# Patient Record
Sex: Male | Born: 1985 | Race: White | Hispanic: No | Marital: Single | Smoking: Current every day smoker
Health system: Southern US, Community
[De-identification: ages and names within clinical notes are randomized; demographics above are authoritative.]

## PROBLEM LIST (undated history)

## (undated) VITALS — BP 121/81 | HR 86 | Temp 97.4°F | Resp 16 | Ht 75.0 in | Wt 192.0 lb

## (undated) DIAGNOSIS — F319 Bipolar disorder, unspecified: Secondary | ICD-10-CM

## (undated) DIAGNOSIS — J189 Pneumonia, unspecified organism: Secondary | ICD-10-CM

## (undated) HISTORY — PX: OTHER SURGICAL HISTORY: SHX169

## (undated) HISTORY — PX: TYMPANOPLASTY: SHX33

## (undated) HISTORY — PX: KNEE SURGERY: SHX244

## (undated) HISTORY — PX: APPENDECTOMY: SHX54

## (undated) HISTORY — PX: TONSILLECTOMY: SUR1361

## (undated) HISTORY — PX: ARTHROSCOPIC REPAIR ACL: SUR80

---
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
## 2004-03-02 ENCOUNTER — Encounter: Admission: RE | Admit: 2004-03-02 | Discharge: 2004-03-02 | Payer: Self-pay | Admitting: Family Medicine

## 2004-03-05 ENCOUNTER — Emergency Department (HOSPITAL_COMMUNITY): Admission: EM | Admit: 2004-03-05 | Discharge: 2004-03-05 | Payer: Self-pay | Admitting: Emergency Medicine

## 2004-05-12 ENCOUNTER — Inpatient Hospital Stay (HOSPITAL_COMMUNITY): Admission: RE | Admit: 2004-05-12 | Discharge: 2004-05-16 | Payer: Self-pay | Admitting: Psychiatry

## 2004-05-12 ENCOUNTER — Ambulatory Visit: Payer: Self-pay | Admitting: Psychiatry

## 2004-10-23 ENCOUNTER — Emergency Department (HOSPITAL_COMMUNITY): Admission: EM | Admit: 2004-10-23 | Discharge: 2004-10-23 | Payer: Self-pay | Admitting: Family Medicine

## 2005-08-10 ENCOUNTER — Ambulatory Visit (HOSPITAL_COMMUNITY): Payer: Self-pay | Admitting: *Deleted

## 2005-10-04 ENCOUNTER — Emergency Department (HOSPITAL_COMMUNITY): Admission: EM | Admit: 2005-10-04 | Discharge: 2005-10-04 | Payer: Self-pay | Admitting: Emergency Medicine

## 2005-10-19 ENCOUNTER — Ambulatory Visit: Payer: Self-pay | Admitting: *Deleted

## 2005-10-19 ENCOUNTER — Inpatient Hospital Stay (HOSPITAL_COMMUNITY): Admission: AD | Admit: 2005-10-19 | Discharge: 2005-10-23 | Payer: Self-pay | Admitting: *Deleted

## 2006-12-27 ENCOUNTER — Emergency Department (HOSPITAL_COMMUNITY): Admission: EM | Admit: 2006-12-27 | Discharge: 2006-12-27 | Payer: Self-pay | Admitting: Emergency Medicine

## 2010-06-10 NOTE — Discharge Summary (Signed)
NAMEEDELMIRO, Brandon Vaughn NO.:  [id number]   MEDICAL RECORD NO.:  [id number]          PATIENT TYPE:  IPS   LOCATION:  0505                          FACILITY:  [facility]   PHYSICIAN:  Jeanice Lim, M.D. DATE OF BIRTH:  [birth year]   DATE OF ADMISSION:  05/12/2004  DATE OF DISCHARGE:  05/16/2004                                 DISCHARGE SUMMARY   IDENTIFYING DATA:  This is a 25 year old engaged white male voluntarily  admitted.  Presenting with bipolar disorder.  Aware of increasing mania.  Recent stressors are parents divorcing.  Paranoid thoughts, agitation,  aggression towards supervisor but mother usually can help.  Now she is  depressed due to her situation, she is unable to monitor the patient  closely.   PAST PSYCHIATRIC HISTORY:  Followed by Dr. Katrinka Blazing, who referred the patient  to [facility].   PRIMARY CARE PHYSICIAN:  Dr. Talmadge Coventry.   MEDICATIONS:  Risperdal, Effexor, Depakote and Toprol.   ALLERGIES:  CODEINE.   PHYSICAL EXAMINATION:  Physical and neurologic exam within normal limits.   LABORATORY DATA:  Routine admission labs within normal limits.   MENTAL STATUS EXAM:  Alert and oriented x 4.  Good eye contact.  Bright  affect.  Speech within normal limits.  Mood anxious.  Restless.  Thought  processes somewhat quick and distractible and denied acute suicidal  ideation.  No psychotic symptoms.  Cognitively intact.  Judgment and insight  fair.  Impulse control fair.   ADMISSION DIAGNOSES:   AXIS I:  Bipolar disorder, hypomanic to manic phase.   AXIS II:  Deferred.   AXIS III:  Tachycardia.   AXIS IV:  Mild to moderate (problems with primary support group).   AXIS V:  Global Assessment of Functioning 34; 60.   HOSPITAL COURSE:  The patient was admitted and ordered routine p.r.n.  medications and underwent further monitoring.  Was encouraged to participate  in individual, group and milieu therapy.  Aftercare planning was  initiated  at the time of admission as well as the patient participated in problem-  solving and learning about his illness and importance of compliance with  medications and safety issues regarding medications.  The patient reported a  positive response to medication changes.   CONDITION ON DISCHARGE:  The patient was discharged in improved condition  with no dangerous ideation after medication education.   DISCHARGE MEDICATIONS:  1.  Effexor XR 150 mg q.h.s.  2.  Depakote ER 500 mg, 2 q.h.s.  3.  Toprol XL 50 mg, 1 b.i.d. as previously prescribed.  4.  Risperdal 1 mg, 1-1/2 q.h.s.   FOLLOW UP:  To follow up with scheduled appointment with Milford Cage next  week.   DISCHARGE DIAGNOSES:   AXIS I:  Bipolar disorder, hypomanic to manic phase.   AXIS II:  Deferred.   AXIS III:  Tachycardia.   AXIS IV:  Mild to moderate (problems with primary support group).   AXIS V:  Global Assessment of Functioning on discharge 55.       JEM/MEDQ  D:  06/29/2004  T:  06/29/2004  Job:  [id number]

## 2010-06-10 NOTE — Discharge Summary (Signed)
NAMETYRIQ, MORAGNE NO.:  [id number]   MEDICAL RECORD NO.:  [id number]          PATIENT TYPE:  IPS   LOCATION:  0401                          FACILITY:  [facility]   PHYSICIAN:  Jasmine Pang, M.D. DATE OF BIRTH:  [birth year]   DATE OF ADMISSION:  10/19/2005  DATE OF DISCHARGE:  10/23/2005                                 DISCHARGE SUMMARY   IDENTIFYING INFORMATION:  A 25 year old single Caucasian male who was  admitted on an involuntary basis to our unit on October 19, 2005.   HISTORY OF PRESENT ILLNESS:  Papers state that patient is psychotic and at  risk to harm himself.  He has had decreased concentration and memory.  He  has been exhibiting strange behaviors according to his mother.  He is also  becoming quite agitated.  He is going into other patient's rooms, hitting  walls.  He appeared to be responding to internal stimuli.  The patient has a  history of bipolar disorder that is currently treated by Dr. Fabio Pierce and Dr. Betti Cruz.  This is his [facility] admission.  The patient's  mother has a history of bipolar disorder.  The patient is currently on  Depakote ER 1000 mg q.h.s. and Effexor XR 150 mg daily.   PHYSICAL FINDINGS:  The patient was in no acute physical distress.   LABORATORY FINDINGS:  An a.m. Depakote was 85.8 (50-100).  TSH was 1.249  (0.35-5.5).  Alcohol level was less than 5.  The basic metabolic panel was  grossly within normal limits.  Hepatic function panel was within normal  limits.   HOSPITAL COURSE:  Upon admission, the patient was continued on his home  medications of Depakote ER 1000 mg p.o. q.h.s. and Effexor XR 150 mg p.o.  daily, Cogentin 1 mg p.o. b.i.d. and Trilafon 8 mg p.o. q.h.s. daily.  He  was also started on Ambien 10 mg p.o. q.h.s. p.r.n. and Ativan 2 mg IM p.o.  t.i.d. p.r.n. extreme anxiety or agitation.  On October 19, 2005, due to  excessive agitation, the patient was given Haldol 10 mg IM now and with a  repeat every hour up to 30 mg.  On 18-Nov-2005, the Effexor was  deceased to 112.5 daily.  The patient was placed on Haldol 5 mg IM or p.o.  q.6h. p.r.n. agitation and Ativan 2 mg IM or p.o. q.6h. p.r.n. agitation.  The patient tolerated these medications well with no significant side-  effects.  Upon admission, the patient was very agitated.  He was going into  other peer's rooms.  He was difficult to redirect.  He was frequently  hitting the walls.  His conversation was not lucid.  He was cursing at  times.  He had earlier been cursing at staff and told a male tech he was  ready to fight.  He was able to be de-escalated and walked to the search  room.  He had a bizarre affect and was unstable behavior.  On October 21, 2005, the patient was in bed after receiving a total of  30 mg of Haldol.  He  is not actively hallucinating at this time.  He was asking appropriate  questions.  Mood was pleasant and cooperative.  He stated he needed his  contacts.   On October 22, 2005, the patient was more lucid.  He was talkative and  appropriate.  He was talkative and appropriate.  He denied auditory or  visual hallucinations.  He was much calmer and not agitated.  Mood was  somewhat anxious.  He wants to go home.  He states he feels he is well  enough now and his mother could care for him.   On October 23, 2005, the patient's mental status had improved markedly.  He  had good eye contact.  Speech was normal rate and flow.  Psychomotor  activity was within normal limits.  He was friendly and cooperative.  His  mood was less depressed and anxious.  Affect wide range.  No suicidal or  homicidal ideation.  No self injurious behavior.  No auditory or visual  hallucinations.  No paranoia or delusions.  Thoughts were logical and goal-  directed.  Thought content no predominant theme.  Cognitive was grossly  within normal limits back to baseline.   DISCHARGE DIAGNOSES:  AXIS I:  Bipolar  disorder, manic severe with  psychosis.  Rule out marijuana abuse.  AXIS II:  None.  AXIS III:  None.  AXIS IV:  Moderate (other psychosocial problems, occupational problem,  educational problem).  AXIS V:  GAF upon discharge was 50.  GAF upon admission was 25.  GAF highest  past year was 75.   DISCHARGE PLANS:  There were no specific activity level or dietary  restrictions.   DISCHARGE MEDICATIONS:  1. Depakote ER 500 mg 2 pills at bedtime.  2. Cogentin 1 mg p.o. b.i.d.  3. Trilafon 8 mg at bedtime.  4. Effexor XR 37.5 mg 3 pills daily.  5. Ambien 10 mg p.o. q.h.s. p.r.n. insomnia.   POST HOSPITAL CARE PLANS:  The patient will return to see Dr. Fabio Pierce on November 15, 2005 at 9:20 a.m.      Jasmine Pang, M.D.  Electronically Signed     BHS/MEDQ  D:  10/23/2005  T:  10/23/2005  Job:  [id number]

## 2010-06-10 NOTE — Discharge Summary (Signed)
NAMEJACINTO, Brandon Vaughn NO.:  [id number]   MEDICAL RECORD NO.:  [id number]                   PATIENT TYPE:  IPS   LOCATION:  0500                                 FACILITY:  BH   PHYSICIAN:  Geoffery Lyons, M.D.                   DATE OF BIRTH:  09-04-1985   DATE OF ADMISSION:  08/30/2002  DATE OF DISCHARGE:  09/08/2002                                 DISCHARGE SUMMARY   CHIEF COMPLAINT AND PRESENT ILLNESS:  This was the first admission to [facility] for this 25 year old male who displayed  increasingly disorganized and grandiose behavior over the past week.  He was  arrested after a car chase by police in [place] [employer]  He resisted the  arrest.  He has had pressured speech and reported he was a rock star and  needed to go to [place] to perform.  His father initiated the petition.  He  was selling things at the airport and part of his charges included  solicitation.  His urine drug screen at the other facility was positive for  marijuana.  There was a family history of bipolar illness.   PAST PSYCHIATRIC HISTORY:  At age 22, he did take 10 or so Aleve.  He  admitted he was acting out.   FAMILY HISTORY:  History of bipolar disorder.  He had a sister who died from  Marfan's syndrome.   SUBSTANCE ABUSE HISTORY:  He stated he used alcohol and marijuana once a  month or so.   PAST MEDICAL HISTORY:  Allergies.   PHYSICAL EXAMINATION:  Physical examination was performed, failed to show  any acute findings.   MENTAL STATUS EXAM:  Mental status exam revealed a drowsy male but oriented  to person, place, and time.  He was medicated, unkempt, decreased personal  hygiene, could be redirected, cooperative.  Slurred speech secondary to the  medication.  Mood: Irritable.  Thought processes: Continued to support  delusions of a rock star, had to go to [place].  Judgment and insight were  poor.  Concentration and memory were poor.   Quite impulsive.   ADMISSION DIAGNOSES:   AXIS I:  Bipolar disorder, manic.   AXIS II:  No diagnosis.   AXIS III:  1. Status post repair of left anterior cruciate ligament.  2. Seasonal allergies.   AXIS IV:  Moderate.   AXIS V:  Global assessment of functioning upon admission 25, highest global  assessment of functioning in the last year 75-80.   LABORATORY DATA:  Other laboratory workup: CBC was within normal limits.  Blood chemistries were within normal limits.  Thyroid profile was within  normal limits.  CT scan of the head failed to show any acute findings.   HOSPITAL COURSE:  He was admitted and started in intensive individual and  group psychotherapy.  He did  evidence a lot of fluctuations.  He was given  Geodon IM.  He was given some Ativan IM.  He was pretty sedated with the  medications.  He was switched to Zyprexa Zydis 5 mg every six hours as  needed.  He was placed on Zyprexa Zydis 5 mg at night.  He was too sedated  so we went ahead and we switched to Risperdal as that was his main  complaint.  We went Risperdal M-Tabs 1 mg twice a day and he was given  Depakote that was increased up to 1000 mg at night.  He was initially very  resistant to taking the medications, very grandiose, claiming an IQ  extremely high.  He wanted to leave the hospital as he had to go to school,  go with his band, very expansive, irritable, angry, absolutely no insight.  He was initially claiming there was nothing wrong with him.  He claimed not  really to be hyperactive but there was the verification that family felt  that he was still not his baseline.  Mother described him as a very laid  back, subdued, a good child.  He insisted that he could play the piano and  the mother confirmed that he did not know how to.  As the medication got  into his system and he was less resistant to it, he started coming around.  He was initially involuntarily committed to the unit.  We requested seven   more days.  He actually accepted it without giving [place] a very hard time.  He  continued to settle down.  He had a family session with his parents.  He was  coming around.  He was able to see how all his behavior suggested bipolar  illness.  He was willing to maintain the medications.  So, on August 16, he  was in full contact with reality.  There was no evidence of active  delusional ideas, no hallucinations, no evidence of grandiosity, increased  insight, no suicidal ideas, no homicidal ideas.  He was willing to stay on  medications so we discharged to outpatient followup.   DISCHARGE DIAGNOSES:   AXIS I:  1. Bipolar disorder, manic.  2. Marijuana abuse.   AXIS II:  No diagnosis.   AXIS III:  1. Status post surgery for his ankle ligament.  2. Seasonal allergies.   AXIS IV:  Moderate.   AXIS V:  Global assessment of functioning upon discharge 55-60.   DISCHARGE MEDICATIONS:  1. Risperdal M-Tabs 1 mg twice a day.  2. Depakote ER 1000 mg at night.  3. Ambien as needed for sleep.   FOLLOW UP:  He was to follow up with mental health IOP at [facility] and then Jasmine Pang, M.D.                                                Geoffery Lyons, M.D.    IL/MEDQ  D:  10/01/2002  T:  10/03/2002  Job:  [id number]

## 2010-06-10 NOTE — H&P (Signed)
NAMEFIRMAN, PETROW NO.:  [id number]   MEDICAL RECORD NO.:  [id number]          PATIENT TYPE:  IPS   LOCATION:  0505                          FACILITY:  BH   PHYSICIAN:  Jeanice Lim, M.D. DATE OF BIRTH:  [birth year]   DATE OF ADMISSION:  05/12/2004  DATE OF DISCHARGE:                         PSYCHIATRIC ADMISSION ASSESSMENT   IDENTIFYING INFORMATION:  Brandon Vaughn is a 25 year old engaged white male who  is voluntarily admitted to the behavioral health center on May 12, 2004.   HISTORY OF PRESENT ILLNESS:  Patient is diagnosed with bipolar disorder and  is very aware of his increasing mania, recent increased stressors of his  parent's divorcing have increased his anxiety, and the patient began having  paranoid thoughts, agitation and aggression toward his supervisor.  Normally  the patient's mother can help with extra medication and therapy, but as she  is depressed due to her situation, she has been unable to monitor the  patient as closely.  The patient contacted his psychiatrist, Dr. Katrinka Blazing, and  she referred the patient to the [facility].   PAST PSYCHIATRIC HISTORY:  The patient was inpatient at [facility] in August 2004, and he is followed by Milford Cage as an outpatient.   SOCIAL HISTORY:  The patient lives with his mother and father in [place].  He has a high school diploma.  He goes to school part-time at [employer]  He works at [employer].  He denies any legal problems at this time.  He  describes his mother as being his social support system.   FAMILY HISTORY:  His mother has depression.  His maternal grandfather was  bipolar, and there is no alcohol or drug abuse in the family that he knows  of.  He denies any alcohol or drug use himself.   PRIMARY CARE Harshini Trent:  Dr. Sheran Fava.   MEDICAL PROBLEMS:  He denies any medical problems, although he is a tobacco  abuser of one pack per day x 2 years.   MEDICATIONS:  1.  Risperdal 1 mg at bedtime.  2.  Effexor 150 mg daily.  3.  Depakote 1000 mg daily.  4.  Toprol XL 50 mg as needed for racing heart rate.   DRUG ALLERGIES:  CODEINE which causes an itch.   REVIEW OF SYSTEMS:  The patient reports that he has had at 15-pound weight  gain since his Depakote dosage was increased recently, over the last 3  months.  He wears contact lenses.  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   PHYSICAL EXAMINATION:  VITAL SIGNS:  Temperature 97.4, pulse 99,  respirations 20, blood pressure 126/76.  GENERAL:  He is a tall, thin, well-developed, well-nourished male who looks  his stated age of 20.  NECK:  He has a full range of motion with 5/5 strength with no  lymphadenopathy.  LUNGS:  Clear to auscultation bilaterally.  BREAST:  Exam was deferred.  HEART:  Regular rate and rhythm without rub, gallop or murmur, no carotid  bruits.  Equal peripheral pulses.  ABDOMEN:  Flat,  firm, nontender with normal bowel sounds.  GENITOURINARY:  Exam deferred.  EXTREMITIES:  A full range of motion with 5/5 strength.  SKIN:  Warm and dry without lesion.  NEUROLOGIC:  Grossly intact.   LABORATORY DATA:  CBC was normal.  His blood chemistries were normal except  for a low BUN of 3.  His TSH was elevated at 8.762.   MENTAL STATUS EXAM:  He was alert and oriented x 4, had good eye contact  with a bright affect.  His appearance was casual.  His behavior was calm and  cooperative.  His speech was clear and soft with even pace and tone.  His  mood was anxious as he was restless.  His thought process was coherent  without suicidal or homicidal ideation.  He had no psychosis and no mania.  His cognitive function, his concentration was normal.  His memory was  intact.  His insight was good.  His impulse control is fair.   DIAGNOSES:   AXIS I:  Bipolar disorder, manic episode.   AXIS II:  Deferred.   AXIS III:  Tachycardia.   AXIS IV:  Mild for problems with his primary  support group.   AXIS V:  Current global assessment of function is 34, over the past year  range of 60-65.   PLAN:  Admit voluntarily, stabilize to thought.  We will resume his home  medications.  We will check a Depakote level again.  We will work to  increase his coping skills, decrease his stressors.  We will have a family  session with his mother and father, and he can follow up with his  psychiatrist, Milford Cage.  His tentative length of stay is 4-6 days.      AHW/MEDQ  D:  05/13/2004  T:  05/13/2004  Job:  [id number]

## 2010-06-10 NOTE — H&P (Signed)
NAMEHUSAYN, REIM NO.:  [id number]   MEDICAL RECORD NO.:  [id number]                   PATIENT TYPE:  IPS   LOCATION:  0402                                 FACILITY:   PHYSICIAN:  Jeanice Lim, M.D.              DATE OF BIRTH:  [birth year]   DATE OF ADMISSION:  DATE OF DISCHARGE:                         PSYCHIATRIC ADMISSION ASSESSMENT   REASON FOR ADMISSION:  Identifying information from the patient and  accompanying records. This 25 year old single white male has displayed  increasingly disorganized and grandiose behavior over the past week. He was  arrested after a car chase by police at [employer] airport. He resisted  arrest. He had pressured speech and reported that he was a rock star and  needed to go to [place] to perform. His father had initiated a petition.  The patient was telling things at the airport and part of his charges  included solicitation. His urine drug screen at the other facility was  positive for THC.   He does have a family history for bipolar illness. It is unclear  whether  the patient in fact has been diagnosed with bipolar prior to this manic  episode or not. Unfortunately the patient is unable to give me information  and his parents are not available at this time. We will be trying to  ascertain other  information.   PAST PSYCHIATRIC HISTORY:  At age 20 the patient did take 10 or so Aleve. He  told me this himself. He said he was acting out. The records indicate that  he overdosed after a breakup with a girlfriend.   SOCIAL HISTORY:  He has graduated high school. He attends [employer]. He is  studying Scientist, physiological.   FAMILY HISTORY:  His maternal grandfather was bipolar. His mother is  depression versus bipolar. He had a sister who died from Marfan syndrome. I  am not clear  exactly when she died. He states it was 5 years ago. It seemed  to be a little more recent  from the accompanying records.   ALCOHOL  AND DRUG HISTORY:  He states that he uses alcohol  and marijuana  about once a month or so.   PAST MEDICAL HISTORY:  His primary care physician is Dr. Talmadge Coventry.  She treats him for allergies and  he is treated with Allegra.   ALLERGIES:  PENICILLIN AND MORPHINE. HE STATES THAT WHEN HE HAD KNEE SURGERY  TO REPAIR AN ACL INJURY THE MORPHINE MADE HIM ITCHY.   PHYSICAL EXAMINATION:  The patient is unusually tall and thin. The remainder  of his physical examination revealed that his lungs were clear. His heart  had a regular rate and rhythm. His abdomen is soft. He had no clubbing,  cyanosis or edema. Cranial nerves 2 to 12 grossly intact. He had poor dental  hygiene. His hygiene is not at the best at the  moment in general.   MENTAL STATUS EXAM:  He is drowsy but he knows person, place and time. He  has been medicated. He is unkempt. He has decreased personal hygiene. He can  be directed. He is cooperative. He is requesting discharge  for a CT scan.  His speech is slurred secondary to his medications. His mood is somewhat  irritable. His thought processes continue to support his delusion that he is  a Solicitor and has to go to [place]. His judgment and insight are poor at  this time. His  concentration and memory are poor. His intelligence is  average.    DIAGNOSES:   AXIS I:  Bipolar 1 manic with psychosis, being that he is believing that he  is a rock star.   AXIS II:  Deferred.   AXIS III:  1. Status post repair of left anterior cruciate ligament.  2. Seasonal allergies treated with Allegra.   AXIS IV:  Mild.   AXIS V:  25.   PLAN:  Admit for safety and stabilization  of his mania. Further information  will be ascertained as to his history, whether he has had any prior  treatment or has had any prior indications of bipolar illness. Estimated  length of stay is 5 to 7 days. He came in and was given Geodon 60 mg p.o.  b.i.d. with food and Ambien 10 mg q. h.s. He  seems to be responding to this.                                               Jeanice Lim, M.D.    Lovie Macadamia  D:  08/31/2002  T:  08/31/2002  Job:  [id number]

## 2010-10-31 LAB — URINALYSIS, ROUTINE W REFLEX MICROSCOPIC
Ketones, ur: NEGATIVE
Nitrite: NEGATIVE
Protein, ur: NEGATIVE
Urobilinogen, UA: 0.2

## 2010-10-31 LAB — BASIC METABOLIC PANEL
BUN: 6
Calcium: 10
Creatinine, Ser: 1.03
GFR calc non Af Amer: >60
Glucose, Bld: 126 — ABNORMAL HIGH

## 2010-10-31 LAB — DIFFERENTIAL
Basophils Absolute: 0
Eosinophils Relative: 1
Lymphocytes Relative: 19
Neutro Abs: 7.5
Neutrophils Relative %: 69

## 2010-10-31 LAB — CBC
HCT: 45.4
Platelets: 188
RDW: 12.4

## 2010-10-31 LAB — TRICYCLICS SCREEN, URINE: TCA Scrn: NOT DETECTED

## 2010-10-31 LAB — RAPID URINE DRUG SCREEN, HOSP PERFORMED
Amphetamines: POSITIVE — AB
Benzodiazepines: POSITIVE — AB

## 2011-01-04 ENCOUNTER — Emergency Department (HOSPITAL_COMMUNITY)
Admission: EM | Admit: 2011-01-04 | Discharge: 2011-01-04 | Disposition: A | Discharge status: Home / Self Care | Payer: Worker's Compensation | Attending: Emergency Medicine | Admitting: Emergency Medicine

## 2011-01-04 ENCOUNTER — Encounter: Payer: Self-pay | Admitting: *Deleted

## 2011-01-04 ENCOUNTER — Emergency Department (HOSPITAL_COMMUNITY): Payer: Worker's Compensation

## 2011-01-04 DIAGNOSIS — S61319A Laceration without foreign body of unspecified finger with damage to nail, initial encounter: Secondary | ICD-10-CM

## 2011-01-04 DIAGNOSIS — Y9269 Other specified industrial and construction area as the place of occurrence of the external cause: Secondary | ICD-10-CM | POA: Insufficient documentation

## 2011-01-04 DIAGNOSIS — F17200 Nicotine dependence, unspecified, uncomplicated: Secondary | ICD-10-CM | POA: Insufficient documentation

## 2011-01-04 DIAGNOSIS — W230XXA Caught, crushed, jammed, or pinched between moving objects, initial encounter: Secondary | ICD-10-CM | POA: Insufficient documentation

## 2011-01-04 DIAGNOSIS — S62639B Displaced fracture of distal phalanx of unspecified finger, initial encounter for open fracture: Secondary | ICD-10-CM | POA: Insufficient documentation

## 2011-01-04 MED ORDER — CEPHALEXIN 500 MG PO CAPS
1000.0000 mg | ORAL_CAPSULE | Freq: Once | ORAL | Status: AC
  Administered 2011-01-04: 1000 mg via ORAL
  Filled 2011-01-04: qty 2

## 2011-01-04 MED ORDER — BUPIVACAINE HCL (PF) 0.5 % IJ SOLN
INTRAMUSCULAR | Status: AC
  Administered 2011-01-04: 16:00:00
  Filled 2011-01-04: qty 30

## 2011-01-04 MED ORDER — TRAMADOL HCL 50 MG PO TABS: 50.0000 mg | ORAL_TABLET | Freq: Four times a day (QID) | ORAL | Status: AC | PRN

## 2011-01-04 MED ORDER — BACITRACIN-NEOMYCIN-POLYMYXIN 400-5-5000 EX OINT
TOPICAL_OINTMENT | CUTANEOUS | Status: AC
  Administered 2011-01-04: 17:00:00
  Filled 2011-01-04: qty 1

## 2011-01-04 MED ORDER — CEPHALEXIN 500 MG PO CAPS: 500.0000 mg | ORAL_CAPSULE | Freq: Four times a day (QID) | ORAL | Status: AC

## 2011-01-04 NOTE — ED Provider Notes (Signed)
Medical screening examination/treatment/procedure(s) were performed by non-physician practitioner and as supervising physician I was immediately available for consultation/collaboration.   Beyonce Sawatzky W Kiela Shisler, MD 01/04/11 2115 

## 2011-01-04 NOTE — ED Notes (Signed)
Pt states she hit his finger on metal edge and cut end of left little finger.

## 2011-01-04 NOTE — ED Notes (Signed)
Left fifth finger dressed with neosporin, xeroform, telfa and kling.  Instructions given and reviewed-verbalizes understanding.  Escorted to lab for urine drug screen.

## 2011-01-04 NOTE — ED Provider Notes (Signed)
History     CSN: [id number] Arrival date & time: 01/04/2011 12:43 PM   First MD Initiated Contact with Patient 01/04/11 1321      Chief Complaint  Patient presents with  . Extremity Laceration    (Consider location/radiation/quality/duration/timing/severity/associated sxs/prior treatment) HPI Comments: Using a wrench and pushing really hard to loosen a nut.  The wrench slipped off and pt crushed L 5th distal phalynx.  DT UTD  The history is provided by the patient. No language interpreter was used.    History reviewed. No pertinent past medical history.  Past Surgical History  Procedure Date  . Tonsillectomy   . Appendectomy   . Arthroscopic repair acl   . Lymph node removal   . Tympanoplasty     History reviewed. No pertinent family history.  History  Substance Use Topics  . Smoking status: Current Everyday Smoker -- 1.0 packs/day  . Smokeless tobacco: Not on file  . Alcohol Use: No      Review of Systems  Musculoskeletal:       Trauma   All other systems reviewed and are negative.    Allergies  Bee venom  Home Medications  No current outpatient prescriptions on file.  BP 123/82  Pulse 92  Temp(Src) 97.3 F (36.3 C) (Oral)  Resp 18  Ht 6\' 3"  (1.905 m)  Wt 180 lb (81.647 kg)  BMI 22.50 kg/m2  SpO2 99%  Physical Exam  Nursing note and vitals reviewed. Constitutional: He is oriented to person, place, and time. He appears well-developed and well-nourished.  HENT:  Head: Normocephalic and atraumatic.  Eyes: EOM are normal.  Neck: Normal range of motion.  Cardiovascular: Normal rate, regular rhythm, normal heart sounds and intact distal pulses.   Pulmonary/Chest: Effort normal and breath sounds normal. No respiratory distress.  Abdominal: Soft. He exhibits no distension. There is no tenderness.  Musculoskeletal: Normal range of motion. He exhibits tenderness.       Left hand: He exhibits tenderness, bony tenderness, laceration and swelling. He  exhibits normal range of motion, normal capillary refill and no deformity. normal sensation noted. Normal strength noted.       Hands: Neurological: He is alert and oriented to person, place, and time.  Skin: Skin is warm and dry.  Psychiatric: He has a normal mood and affect. Judgment normal.    ED Course  LACERATION REPAIR Date/Time: 01/04/2011 4:05 PM Performed by: Worthy Rancher Authorized by: Worthy Rancher Consent: Verbal consent obtained. Written consent not obtained. Risks and benefits: risks, benefits and alternatives were discussed Consent given by: patient Patient understanding: patient states understanding of the procedure being performed Site marked: the operative site was not marked Imaging studies: imaging studies available Required items: required blood products, implants, devices, and special equipment available Patient identity confirmed: verbally with patient Time out: Immediately prior to procedure a "time out" was called to verify the correct patient, procedure, equipment, support staff and site/side marked as required. Body area: upper extremity Location details: left small finger Laceration length: 2.5 cm Foreign bodies: no foreign bodies Tendon involvement: none Nerve involvement: none Vascular damage: no Anesthesia: digital block Local anesthetic: bupivacaine 0.5% without epinephrine Anesthetic total: 6 ml Patient sedated: no Preparation: Patient was prepped and draped in the usual sterile fashion. Irrigation solution: saline Irrigation method: syringe Amount of cleaning: extensive Debridement: none Degree of undermining: none Wound skin closure material used: nailbed closed with #5 6-0 vicryl interrupted sutures. Number of sutures: 5 Technique: simple Approximation: close Approximation  difficulty: simple Dressing: gauze roll, non-adhesive packing strip and antibiotic ointment Patient tolerance: Patient tolerated the procedure well with no  immediate complications. Comments: After digital block performed, nail undermined using iris scissors.  Same procedure used to remove proximal section of nail covering the nail matrix.  Minimal bleeding.     (including critical care time)  Labs Reviewed - No data to display Dg Finger Little Left  01/04/2011  *RADIOLOGY REPORT*  Clinical Data: Trauma.  LEFT LITTLE FINGER 2+V  Comparison: None.  Findings: Fracture of the tuft of the left fifth finger distal phalanx.  No radiopaque foreign body.  IMPRESSION: Fracture of the tuft of the left fifth finger distal phalanx.  No radiopaque foreign body.  Original Report Authenticated By: Fuller Canada, M.D.     No diagnosis found.    MDM          Worthy Rancher, PA 01/04/11 [phone]

## 2011-01-04 NOTE — ED Notes (Signed)
ED PA at bedside to suture wound

## 2011-02-21 ENCOUNTER — Other Ambulatory Visit: Payer: Self-pay

## 2011-02-21 ENCOUNTER — Observation Stay (HOSPITAL_COMMUNITY)
Admission: EM | Admit: 2011-02-21 | Discharge: 2011-02-22 | DRG: 143 | Disposition: A | Discharge status: Home / Self Care | Payer: BC Managed Care – PPO | Attending: Internal Medicine | Admitting: Internal Medicine

## 2011-02-21 ENCOUNTER — Emergency Department (HOSPITAL_COMMUNITY): Payer: BC Managed Care – PPO

## 2011-02-21 ENCOUNTER — Encounter (HOSPITAL_COMMUNITY): Payer: Self-pay | Admitting: *Deleted

## 2011-02-21 DIAGNOSIS — R799 Abnormal finding of blood chemistry, unspecified: Secondary | ICD-10-CM | POA: Diagnosis present

## 2011-02-21 DIAGNOSIS — R197 Diarrhea, unspecified: Secondary | ICD-10-CM

## 2011-02-21 DIAGNOSIS — R079 Chest pain, unspecified: Secondary | ICD-10-CM

## 2011-02-21 DIAGNOSIS — I514 Myocarditis, unspecified: Secondary | ICD-10-CM

## 2011-02-21 DIAGNOSIS — D696 Thrombocytopenia, unspecified: Secondary | ICD-10-CM | POA: Diagnosis present

## 2011-02-21 DIAGNOSIS — Z79899 Other long term (current) drug therapy: Secondary | ICD-10-CM

## 2011-02-21 DIAGNOSIS — R0789 Other chest pain: Principal | ICD-10-CM | POA: Insufficient documentation

## 2011-02-21 DIAGNOSIS — D649 Anemia, unspecified: Secondary | ICD-10-CM | POA: Diagnosis present

## 2011-02-21 DIAGNOSIS — F17200 Nicotine dependence, unspecified, uncomplicated: Secondary | ICD-10-CM | POA: Diagnosis present

## 2011-02-21 DIAGNOSIS — F319 Bipolar disorder, unspecified: Secondary | ICD-10-CM | POA: Diagnosis present

## 2011-02-21 DIAGNOSIS — Z8241 Family history of sudden cardiac death: Secondary | ICD-10-CM

## 2011-02-21 DIAGNOSIS — E860 Dehydration: Secondary | ICD-10-CM

## 2011-02-21 DIAGNOSIS — I498 Other specified cardiac arrhythmias: Secondary | ICD-10-CM | POA: Diagnosis present

## 2011-02-21 HISTORY — DX: Bipolar disorder, unspecified: F31.9

## 2011-02-21 HISTORY — DX: Pneumonia, unspecified organism: J18.9

## 2011-02-21 LAB — CREATININE, SERUM
Creatinine, Ser: 0.77 mg/dL (ref 0.50–1.35)
GFR calc non Af Amer: >90 mL/min (ref >90)

## 2011-02-21 LAB — DIFFERENTIAL
Basophils Absolute: 0 10*3/uL (ref 0.0–0.1)
Lymphocytes Relative: 10 % — ABNORMAL LOW (ref 12–46)
Monocytes Absolute: 1.3 10*3/uL — ABNORMAL HIGH (ref 0.1–1.0)
Neutro Abs: 8.5 10*3/uL — ABNORMAL HIGH (ref 1.7–7.7)

## 2011-02-21 LAB — BASIC METABOLIC PANEL
CO2: 28 mEq/L (ref 19–32)
Chloride: 104 mEq/L (ref 96–112)
Creatinine, Ser: 0.69 mg/dL (ref 0.50–1.35)
Sodium: 140 mEq/L (ref 135–145)

## 2011-02-21 LAB — CBC
HCT: 34.8 % — ABNORMAL LOW (ref 39.0–52.0)
Hemoglobin: 13.2 g/dL (ref 13.0–17.0)
MCHC: 34.9 g/dL (ref 30.0–36.0)
Platelets: 150 10*3/uL (ref 150–400)
RDW: 12.6 % (ref 11.5–15.5)
RDW: 12.8 % (ref 11.5–15.5)
WBC: 11 10*3/uL — ABNORMAL HIGH (ref 4.0–10.5)

## 2011-02-21 LAB — CARDIAC PANEL(CRET KIN+CKTOT+MB+TROPI)
CK, MB: 6.2 ng/mL (ref 0.3–4.0)
CK, MB: 5.7 ng/mL — ABNORMAL HIGH (ref 0.3–4.0)
CK, MB: 4.4 ng/mL — ABNORMAL HIGH (ref 0.3–4.0)
CK, MB: 3.7 ng/mL (ref 0.3–4.0)
Relative Index: 6 — ABNORMAL HIGH (ref 0.0–2.5)
Relative Index: INVALID (ref 0.0–2.5)
Total CK: 104 U/L (ref 7–232)
Troponin I: 0.71 ng/mL (ref <0.30)
Troponin I: 0.38 ng/mL (ref <0.30)

## 2011-02-21 LAB — RAPID URINE DRUG SCREEN, HOSP PERFORMED
Barbiturates: NOT DETECTED
Cocaine: NOT DETECTED

## 2011-02-21 LAB — TSH: TSH: 3.255 u[IU]/mL (ref 0.350–4.500)

## 2011-02-21 LAB — MAGNESIUM: Magnesium: 1.6 mg/dL (ref 1.5–2.5)

## 2011-02-21 MED ORDER — IOHEXOL 350 MG/ML SOLN
50.0000 mL | Freq: Once | INTRAVENOUS | Status: AC | PRN
  Administered 2011-02-21: 50 mL via INTRAVENOUS

## 2011-02-21 MED ORDER — ACETAMINOPHEN 325 MG PO TABS: 650.0000 mg | ORAL_TABLET | ORAL | Status: DC | PRN

## 2011-02-21 MED ORDER — MORPHINE SULFATE 4 MG/ML IJ SOLN
4.0000 mg | INTRAMUSCULAR | Status: DC | PRN
  Administered 2011-02-21: 4 mg via INTRAVENOUS
  Filled 2011-02-21: qty 1

## 2011-02-21 MED ORDER — GI COCKTAIL ~~LOC~~
30.0000 mL | Freq: Three times a day (TID) | ORAL | Status: DC | PRN
  Administered 2011-02-21: 30 mL via ORAL
  Filled 2011-02-21: qty 30

## 2011-02-21 MED ORDER — NITROGLYCERIN 0.4 MG SL SUBL: 0.4000 mg | SUBLINGUAL_TABLET | SUBLINGUAL | Status: DC | PRN

## 2011-02-21 MED ORDER — ONDANSETRON HCL 4 MG/2ML IJ SOLN
4.0000 mg | Freq: Once | INTRAMUSCULAR | Status: AC
  Administered 2011-02-21: 4 mg via INTRAVENOUS
  Filled 2011-02-21: qty 2

## 2011-02-21 MED ORDER — SODIUM CHLORIDE 0.9 % IJ SOLN: 3.0000 mL | INTRAMUSCULAR | Status: DC | PRN

## 2011-02-21 MED ORDER — DIVALPROEX SODIUM ER 500 MG PO TB24
500.0000 mg | ORAL_TABLET | Freq: Two times a day (BID) | ORAL | Status: DC
  Administered 2011-02-21 – 2011-02-22 (×3): 500 mg via ORAL
  Filled 2011-02-21 (×4): qty 1

## 2011-02-21 MED ORDER — VITAMIN C 500 MG PO TABS
500.0000 mg | ORAL_TABLET | Freq: Two times a day (BID) | ORAL | Status: DC
  Administered 2011-02-21 – 2011-02-22 (×3): 500 mg via ORAL
  Filled 2011-02-21 (×4): qty 1

## 2011-02-21 MED ORDER — VENLAFAXINE HCL 75 MG PO TABS
75.0000 mg | ORAL_TABLET | Freq: Two times a day (BID) | ORAL | Status: DC
  Administered 2011-02-21 – 2011-02-22 (×3): 75 mg via ORAL
  Filled 2011-02-21 (×4): qty 1

## 2011-02-21 MED ORDER — ENOXAPARIN SODIUM 40 MG/0.4ML ~~LOC~~ SOLN
40.0000 mg | SUBCUTANEOUS | Status: DC
  Administered 2011-02-21 – 2011-02-22 (×2): 40 mg via SUBCUTANEOUS
  Filled 2011-02-21 (×3): qty 0.4

## 2011-02-21 MED ORDER — AMPHETAMINE-DEXTROAMPHETAMINE 10 MG PO TABS: 20.0000 mg | ORAL_TABLET | ORAL | Status: DC

## 2011-02-21 MED ORDER — MORPHINE SULFATE 4 MG/ML IJ SOLN
4.0000 mg | Freq: Once | INTRAMUSCULAR | Status: AC
  Administered 2011-02-21: 4 mg via INTRAVENOUS
  Filled 2011-02-21: qty 1

## 2011-02-21 MED ORDER — ONE-A-DAY MENS PO TABS
1.0000 | ORAL_TABLET | Freq: Every day | ORAL | Status: DC
  Administered 2011-02-21 – 2011-02-22 (×2): 1 via ORAL
  Filled 2011-02-21 (×2): qty 1

## 2011-02-21 MED ORDER — SODIUM CHLORIDE 0.9 % IJ SOLN
3.0000 mL | Freq: Two times a day (BID) | INTRAMUSCULAR | Status: DC
  Administered 2011-02-21 – 2011-02-22 (×3): 3 mL via INTRAVENOUS

## 2011-02-21 MED ORDER — SODIUM CHLORIDE 0.9 % IV SOLN: 250.0000 mL | INTRAVENOUS | Status: DC | PRN

## 2011-02-21 MED ORDER — ONDANSETRON HCL 4 MG/2ML IJ SOLN: 4.0000 mg | Freq: Four times a day (QID) | INTRAMUSCULAR | Status: DC | PRN

## 2011-02-21 MED ORDER — ASPIRIN 81 MG PO CHEW
324.0000 mg | CHEWABLE_TABLET | Freq: Once | ORAL | Status: AC
  Administered 2011-02-21: 324 mg via ORAL
  Filled 2011-02-21: qty 4

## 2011-02-21 MED ORDER — SODIUM CHLORIDE 0.9 % IV BOLUS (SEPSIS)
1000.0000 mL | Freq: Once | INTRAVENOUS | Status: AC
  Administered 2011-02-21: 1000 mL via INTRAVENOUS

## 2011-02-21 MED ORDER — ASPIRIN EC 81 MG PO TBEC
81.0000 mg | DELAYED_RELEASE_TABLET | Freq: Every day | ORAL | Status: DC
  Administered 2011-02-22: 81 mg via ORAL
  Filled 2011-02-21: qty 1

## 2011-02-21 NOTE — ED Provider Notes (Signed)
History     CSN: [id number]  Arrival date & time 02/21/11  0104   First MD Initiated Contact with Patient 02/21/11 0143      Chief Complaint  Patient presents with  . Chest Pain  . 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 States that for the past 4 days he is been experiencing nausea, vomiting, diarrhea. He states that his nausea and vomiting resolved 2 days ago but he is still having some diarrhea. He describes as nonbloody. He denies abdominal pain. He states that yesterday he began to feel severe chest pain. It is sharp and does not radiate to his back but does get worse with lying flat on his back. He denies fevers. He is having chills. There is no cough. There multiple sick contacts at home with diarrhea. He denies shortness of breath and states that the pain does get worse when he takes a deep breath. States that he has a suspected history of Marfan's. He has a positive family history and his sister died at the age of 49 from a ruptured aorta per patient.    ED Notes, ED Provider Notes from 02/21/11 0000 to 02/21/11 01:21:29       Wynona Canes Chrisco, RN 02/21/2011 01:13      The pt is c/o nausea Vomiting and diarrhea since Friday with some chest pain with vomiting     History reviewed. No pertinent past medical history.  Past Surgical History  Procedure Date  . Tonsillectomy   . Appendectomy   . Arthroscopic repair acl   . Lymph node removal   . Tympanoplasty     History reviewed. No pertinent family history.  History  Substance Use Topics  . Smoking status: Current Everyday Smoker -- 1.0 packs/day  . Smokeless tobacco: Not on file  . Alcohol Use: No      Review of Systems  All other systems reviewed and are negative.   except as noted HPI   Allergies  Bee venom; Codeine; and Hydrocodone  Home Medications   Current Outpatient Rx    Name Route Sig Dispense Refill  . AMPHETAMINE-DEXTROAMPHETAMINE 20 MG PO TABS Oral Take 20 mg by mouth every morning.      Marland Kitchen DIVALPROEX SODIUM ER 500 MG PO TB24 Oral Take 500 mg by mouth 2 (two) times daily.    . ONE-A-DAY MENS PO TABS Oral Take 1 tablet by mouth daily.      Marland Kitchen NAPROXEN SODIUM 220 MG PO TABS Oral Take 220 mg by mouth 2 (two) times daily with a meal.    . VENLAFAXINE HCL 75 MG PO TABS Oral Take 75 mg by mouth 2 (two) times daily.    Marland Kitchen VITAMIN C 500 MG PO TABS Oral Take 500 mg by mouth 2 (two) times daily.        BP 112/67  Pulse 119  Temp(Src) 98.4 F (36.9 C) (Oral)  Resp 18  SpO2 100%  Physical Exam  Nursing note and vitals reviewed. Constitutional: He is oriented to person, place, and time. He appears well-developed and well-nourished. No distress.  HENT:  Head: Atraumatic.       Mm dry  Eyes: Conjunctivae are normal. Pupils are equal, round, and reactive to light.  Neck: Neck supple. No JVD present.  Cardiovascular: Regular rhythm, normal heart sounds and intact distal pulses.  Exam reveals no gallop and no friction rub.   No murmur heard.  tachycardic  Pulmonary/Chest: Effort normal. No respiratory distress. He has no wheezes. He has no rales.  Abdominal: Soft. Bowel sounds are normal. There is no tenderness. There is no rebound and no guarding.  Musculoskeletal: Normal range of motion. He exhibits no edema and no tenderness.  Neurological: He is alert and oriented to person, place, and time.  Skin: Skin is warm and dry.  Psychiatric: He has a normal mood and affect.    Date: 02/21/2011  Rate: 111  Rhythm: sinus tachycardia  QRS Axis: normal  Intervals: normal  ST/T Wave abnormalities: nonspecific ST changes  Conduction Disutrbances:none  Narrative Interpretation:   Old EKG Reviewed: none available  ED Course  Procedures (including critical care time)  Labs Reviewed  CBC - Abnormal; Notable for the following:    WBC 11.0 (*)    RBC 4.12 (*)     Hemoglobin 12.3 (*)    HCT 34.8 (*)    Platelets 132 (*)    All other components within normal limits  DIFFERENTIAL - Abnormal; Notable for the following:    Neutrophils Relative 78 (*)    Neutro Abs 8.5 (*)    Lymphocytes Relative 10 (*)    Monocytes Absolute 1.3 (*)    All other components within normal limits  BASIC METABOLIC PANEL - Abnormal; Notable for the following:    BUN 4 (*)    All other components within normal limits  CARDIAC PANEL(CRET KIN+CKTOT+MB+TROPI) - Abnormal; Notable for the following:    CK, MB 6.2 (*)    Troponin I 0.71 (*)    Relative Index 6.0 (*)    All other components within normal limits  PRO B NATRIURETIC PEPTIDE - Abnormal; Notable for the following:    Pro B Natriuretic peptide (BNP) 1446.0 (*)    All other components within normal limits   Ct Angio Chest W/cm &/or Wo Cm  02/21/2011  *RADIOLOGY REPORT*  Clinical Data: Severe chest pain and back pain.  Nausea, vomiting, and diarrhea.  Concern for dissection in the chest.  CT ANGIOGRAPHY CHEST  Technique:  Multidetector CT imaging of the chest using the standard protocol during bolus administration of intravenous contrast. Multiplanar reconstructed images including MIPs were obtained and reviewed to evaluate the vascular anatomy.  Contrast: 50mL OMNIPAQUE IOHEXOL 350 MG/ML IV SOLN  Comparison: None.  Findings: Unenhanced images demonstrate normal caliber thoracic aorta.  No significant vascular calcification.  No evidence of penetrating ulcer or intramural hematoma.  Images obtained after contrast administration demonstrate normal caliber thoracic aorta.  No evidence of dissection.  Great vessels demonstrate normal branching pattern and are patent.  Visualized central pulmonary arteries demonstrate no focal filling defects. Slight increased density in the anterior mediastinum consistent with residual thymus tissue.  Scattered lymph nodes in the chest are not pathologically enlarged.  The esophagus is  decompressed. No abnormal mediastinal fluid collections.  No pleural effusions. Dependent atelectasis in the lungs.  No focal airspace consolidation or interstitial disease.  No pneumothorax.  Normal alignment of the lumbar vertebrae.  IMPRESSION: No evidence of aortic aneurysm or dissection.  No acute process demonstrated in the chest.  Original Report Authenticated By: Marlon Pel, M.D.     1. Myocarditis   2. Chest pain   3. Dehydration   4. Diarrhea      MDM  PW CP after N/V/D. Appears dehydrated. DDx includes pericarditis, myocarditis, esophageal perforation less likely, aortic dissection given history of suspected Marfan's and strong family history. IV fluids, cardiac enzymes, CK  chest. Reassess.  BP 112/67  Pulse 119  Temp(Src) 98.4 F (36.9 C) (Oral)  Resp 18  SpO2 100%  Patient remains consistently tachycardic although he states his pain is controlled and is declining further analgesia at this time. CT chest pending. CE elevated-- suspect viral myocarditis. No si/sx fluid overload at this time. Will discuss with cardiology.  CTA angio reviewed and unremarkable.   Discussed admission with cardiology. Family aware of plan.         Forbes Cellar, MD 02/21/11 [phone]

## 2011-02-21 NOTE — ED Notes (Signed)
Dr Hyman Hopes notified of critical values for Troponin and CKMB.

## 2011-02-21 NOTE — ED Notes (Signed)
Cardiology MD at bedside.

## 2011-02-21 NOTE — ED Notes (Signed)
The pt is c/o nausea  Vomiting and diarrhea since Friday with some chest pain with vomiting

## 2011-02-21 NOTE — H&P (Signed)
Cardiology H&P  Primary Care Povider: Eartha Inch, MD, MD Primary Cardiologist: none   HPI: Brandon Vaughn is a 26 y.o.male with history of tobacco abuse, family history of Marfan Syndrome (sister died at 44 from aortic rupture), no prior known cardiac history presented to the ER the the CC of sudden onset of central chest pain. Chest pain is described as severe, sharp, worse with deep inspiration and lying down, improved with sitting up, that has been present for the past 16-18 hours. Pt denies associated shortness of breath, diphoresis, nausea, vomiting, orthopnea, PND, LEE, syncope, or palpitations. Of note, Pt has been recovering from a GI illness with nausea, vomiting and diarrhea over the course of the past 3-4 days.  GI symptoms have been resolving, Pt denies fevers, chills, night sweats, or other complaints. In the ER, Pt had mildly elevated troponin, negative CTA and other work up.   Past Medical History  Diagnosis Date  . Bipolar 1 disorder     Past Surgical History  Procedure Date  . Tonsillectomy   . Appendectomy   . Arthroscopic repair acl   . Lymph node removal   . Tympanoplasty     Family History  Problem Relation Age of Onset  . Heart disease Sister     Social History:  reports that he has been smoking.  He does not have any smokeless tobacco history on file. He reports that he does not drink alcohol or use illicit drugs.  Allergies:  Allergies  Allergen Reactions  . Bee Venom Anaphylaxis  . Codeine   . Hydrocodone     Current Facility-Administered Medications  Medication Dose Route Frequency Provider Last Rate Last Dose  . aspirin chewable tablet 324 mg  324 mg Oral Once Forbes Cellar, MD   324 mg at 02/21/11 0358  . iohexol (OMNIPAQUE) 350 MG/ML injection 50 mL  50 mL Intravenous Once PRN Medication Radiologist, MD   50 mL at 02/21/11 0342  . morphine 4 MG/ML injection 4 mg  4 mg Intravenous Once Forbes Cellar, MD   4 mg at 02/21/11 0209  . morphine 4  MG/ML injection 4 mg  4 mg Intravenous Once Forbes Cellar, MD   4 mg at 02/21/11 0454  . ondansetron (ZOFRAN) injection 4 mg  4 mg Intravenous Once Forbes Cellar, MD   4 mg at 02/21/11 0153  . sodium chloride 0.9 % bolus 1,000 mL  1,000 mL Intravenous Once Forbes Cellar, MD   1,000 mL at 02/21/11 0153   Current Outpatient Prescriptions  Medication Sig Dispense Refill  . amphetamine-dextroamphetamine (ADDERALL) 20 MG tablet Take 20 mg by mouth every morning.        . divalproex (DEPAKOTE ER) 500 MG 24 hr tablet Take 500 mg by mouth 2 (two) times daily.      . multivitamin (ONE-A-DAY MEN'S) TABS Take 1 tablet by mouth daily.        Marland Kitchen venlafaxine (EFFEXOR) 75 MG tablet Take 75 mg by mouth 2 (two) times daily.      . vitamin C (ASCORBIC ACID) 500 MG tablet Take 500 mg by mouth 2 (two) times daily.          ROS: A full review of systems is obtained and is negative except as noted in the HPI.  Physical Exam: Blood pressure 112/67, pulse 119, temperature 98.4 F (36.9 C), temperature source Oral, resp. rate 18, SpO2 100.00%.  GENERAL: no acute distress.  EYES: Extra ocular movements are intact. There is no lid lag.  Sclera is anicteric.  ENT: Oropharynx is clear. Dentition is within normal limits.  NECK: Supple. The thyroid is not enlarged.  HEART: RR, tachycardic, I/VI SM, no S3 or S4, no rubs, PMI normal LUNGS: Clear to auscultation There are no rales, rhonchi, or wheezes.  ABDOMEN: Soft, non-tender, and non-distended with normoactive bowel sounds.   EXTREMITIES: No clubbing, cyanosis, or edema.  PULSES: Carotids were +2 and equal bilaterally with no bruits. Femoral pulses were +2 and equal bilaterally. DP/PT pulses were +2 and equal bilaterally.  SKIN: Warm, dry, and intact.  NEUROLOGIC: The patient was oriented to person, place, and time. No overt neurologic deficits were detected.  PSYCH: Normal judgment and insight, mood is appropriate.   Results: Results for orders placed during the  hospital encounter of 02/21/11 (from the past 24 hour(s))  CBC     Status: Abnormal   Collection Time   02/21/11  1:59 AM      Component Value Range   WBC 11.0 (*) 4.0 - 10.5 (K/uL)   RBC 4.12 (*) 4.22 - 5.81 (MIL/uL)   Hemoglobin 12.3 (*) 13.0 - 17.0 (g/dL)   HCT 16.1 (*) 09.6 - 52.0 (%)   MCV 84.5  78.0 - 100.0 (fL)   MCH 29.9  26.0 - 34.0 (pg)   MCHC 35.3  30.0 - 36.0 (g/dL)   RDW 04.5  40.9 - 81.1 (%)   Platelets 132 (*) 150 - 400 (K/uL)  DIFFERENTIAL     Status: Abnormal   Collection Time   02/21/11  1:59 AM      Component Value Range   Neutrophils Relative 78 (*) 43 - 77 (%)   Neutro Abs 8.5 (*) 1.7 - 7.7 (K/uL)   Lymphocytes Relative 10 (*) 12 - 46 (%)   Lymphs Abs 1.1  0.7 - 4.0 (K/uL)   Monocytes Relative 12  3 - 12 (%)   Monocytes Absolute 1.3 (*) 0.1 - 1.0 (K/uL)   Eosinophils Relative 0  0 - 5 (%)   Eosinophils Absolute 0.0  0.0 - 0.7 (K/uL)   Basophils Relative 0  0 - 1 (%)   Basophils Absolute 0.0  0.0 - 0.1 (K/uL)  BASIC METABOLIC PANEL     Status: Abnormal   Collection Time   02/21/11  1:59 AM      Component Value Range   Sodium 140  135 - 145 (mEq/L)   Potassium 3.6  3.5 - 5.1 (mEq/L)   Chloride 104  96 - 112 (mEq/L)   CO2 28  19 - 32 (mEq/L)   Glucose, Bld 98  70 - 99 (mg/dL)   BUN 4 (*) 6 - 23 (mg/dL)   Creatinine, Ser 9.14  0.50 - 1.35 (mg/dL)   Calcium 9.1  8.4 - 78.2 (mg/dL)   GFR calc non Af Amer >90  >90 (mL/min)   GFR calc Af Amer >90  >90 (mL/min)  CARDIAC PANEL(CRET KIN+CKTOT+MB+TROPI)     Status: Abnormal   Collection Time   02/21/11  1:59 AM      Component Value Range   Total CK 104  7 - 232 (U/L)   CK, MB 6.2 (*) 0.3 - 4.0 (ng/mL)   Troponin I 0.71 (*) <0.30 (ng/mL)   Relative Index 6.0 (*) 0.0 - 2.5   PRO B NATRIURETIC PEPTIDE     Status: Abnormal   Collection Time   02/21/11  3:57 AM      Component Value Range   Pro B Natriuretic peptide (BNP) 1446.0 (*)  0 - 125 (pg/mL)    EKG: sinus tachycardia, PVCs, no classic changes consistent  with pericarditis (PR depression or diffuse STE)  CTA: No evidence of aortic aneurysm or dissection. No acute process demonstrated in the chest.   Assessment/Plan:  25 y.o.male with history of tobacco abuse, family history of Marfan Syndrome (sister died at 58 from aortic rupture), no prior known cardiac history presented to the ER with chest pain, mildly elevated troponin and CKMB, in the setting of a recent viral prodrome with mild anemia, thrombocytopenia, and tachycardia concerning for possible myocarditis/pericarditis without classic ECG findings to suggest pericarditis  --admit to telemetry floor, cycle cardiac markers  --obtain transthoracic echocardiogram to evaluated LF function  --without clear signs of pericarditis on ECG, will not initiate empiric high dose NSAIDs and colchicine as if Pt has more of a myocarditis picture, NSAIDs may not be beneficial and may cause harm  --f/u TTE and CM results, further plan pending the above work up  --continue home bipolar medications  --IVF for h/o GI illness and tachcyardia  --enoxaparin for DVT PPX   Brandon Vaughn 02/21/2011, 5:25 AM

## 2011-02-21 NOTE — Progress Notes (Signed)
Patient ID: Brandon Vaughn, male   DOB: [birth year], 28 y.o.   MRN: [id number] Subjective:  Called to see patient with continued sscp. His pain is made worsen by inspiration and movement up or down. No relieving factors except for lying still.   Objective:  Vital Signs in the last 24 hours: Temp:  [97.8 F (36.6 C)-98.4 F (36.9 C)] 98.3 F (36.8 C) (01/29 0650) Pulse Rate:  [102-119] 102  (01/29 0650) Resp:  [17-20] 18  (01/29 0650) BP: (104-119)/(65-76) 112/67 mmHg (01/29 0650) SpO2:  [99 %-100 %] 99 % (01/29 0650) Weight:  [82.5 kg (181 lb 14.1 oz)] 82.5 kg (181 lb 14.1 oz) (01/29 0650)  Intake/Output from previous day: 01/28 0701 - 01/29 0700 In: -  Out: 2375 [Urine:2375] Intake/Output from this shift: Total I/O In: 3 [I.V.:3] Out: -   Physical Exam: Ill appearing young man, NAD HEENT: Unremarkable Neck:  No JVD, no thyromegally Lungs:  Clear with no wheezes. HEART:  Regular rate rhythm, no murmurs, no rubs, no clicks. S4 gallop is present. Abd:  Flat, positive bowel sounds, no organomegally, no rebound, no guarding Ext:  2 plus pulses, no edema, no cyanosis, no clubbing Skin:  No rashes no nodules Neuro:  CN II through XII intact, motor grossly intact  Lab Results:  Basename 02/21/11 0817 02/21/11 0159  WBC 11.2* 11.0*  HGB 13.2 12.3*  PLT 150 132*    Basename 02/21/11 0817 02/21/11 0159  NA -- 140  K -- 3.6  CL -- 104  CO2 -- 28  GLUCOSE -- 98  BUN -- 4*  CREATININE 0.77 0.69    Basename 02/21/11 1320 02/21/11 0817  TROPONINI <0.30 0.38*   Hepatic Function Panel No results found for this basename: PROT,ALBUMIN,AST,ALT,ALKPHOS,BILITOT,BILIDIR,IBILI in the last 72 hours No results found for this basename: CHOL in the last 72 hours No results found for this basename: PROTIME in the last 72 hours  Imaging: Ct Angio Chest W/cm &/or Wo Cm  02/21/2011  *RADIOLOGY REPORT*  Clinical Data: Severe chest pain and back pain.  Nausea, vomiting, and diarrhea.  Concern  for dissection in the chest.  CT ANGIOGRAPHY CHEST  Technique:  Multidetector CT imaging of the chest using the standard protocol during bolus administration of intravenous contrast. Multiplanar reconstructed images including MIPs were obtained and reviewed to evaluate the vascular anatomy.  Contrast: 50mL OMNIPAQUE IOHEXOL 350 MG/ML IV SOLN  Comparison: None.  Findings: Unenhanced images demonstrate normal caliber thoracic aorta.  No significant vascular calcification.  No evidence of penetrating ulcer or intramural hematoma.  Images obtained after contrast administration demonstrate normal caliber thoracic aorta.  No evidence of dissection.  Great vessels demonstrate normal branching pattern and are patent.  Visualized central pulmonary arteries demonstrate no focal filling defects. Slight increased density in the anterior mediastinum consistent with residual thymus tissue.  Scattered lymph nodes in the chest are not pathologically enlarged.  The esophagus is decompressed. No abnormal mediastinal fluid collections.  No pleural effusions. Dependent atelectasis in the lungs.  No focal airspace consolidation or interstitial disease.  No pneumothorax.  Normal alignment of the lumbar vertebrae.  IMPRESSION: No evidence of aortic aneurysm or dissection.  No acute process demonstrated in the chest.  Original Report Authenticated By: Marlon Pel, M.D.    Cardiac Studies: Tele - NSR with PVC's. Assessment/Plan:  1. Chest pain - his symptoms are that his pain began 2 days after experiencing vomiting and diarrhea. He notes that he vomited "hard". I wonder about an  esophageal problem. Will treat pain with narcotics. Await 2D echo.  LOS: 0 days    Lewayne Bunting 02/21/2011, 2:56 PM

## 2011-02-21 NOTE — ED Notes (Signed)
Ok to eat/drink per cardiologist, Sallyanne Kuster. Provided patient with happy meal, crackers and Coke.

## 2011-02-21 NOTE — ED Notes (Signed)
Patient transported to CT 

## 2011-02-21 NOTE — ED Notes (Signed)
EDMD at bedside

## 2011-02-22 DIAGNOSIS — R072 Precordial pain: Secondary | ICD-10-CM

## 2011-02-22 LAB — LIPID PANEL
Cholesterol: 97 mg/dL (ref 0–200)
HDL: 29 mg/dL — ABNORMAL LOW (ref >39)
Total CHOL/HDL Ratio: 3.3 RATIO
Triglycerides: 73 mg/dL (ref <150)

## 2011-02-22 NOTE — Discharge Summary (Signed)
Discharge Summary   Patient ID: Brandon Vaughn MRN: [id number], DOB/AGE: Sep 23, 1985 26 y.o.  Primary MD: Eartha Inch, MD, MD Primary Cardiologist: Lewayne Bunting MD Admit date: 02/21/2011 D/C date:     02/22/2011      Primary Discharge Diagnoses:  1. Chest pain, Atypical improved r/t N/V   - Cardiac enzymes initially elevated, trended down to normal  - CTA chest without evidence of aortic aneurysm or dissection  - Echo 02/22/11 revealed normal LV size and systolic function, EF 55%, normal diastolic function, normal RV size and systolic function, no significant valvular abnormalities.  Secondary Discharge Diagnoses:  1. Bipolar 1 Disorder  Allergies Allergen Reactions  . Bee Venom Anaphylaxis  . Codeine   . Hydrocodone     Diagnostic Studies/Procedures:   02/22/11 - 2D Echocardiogram Study Conclusions:  - Left ventricle: The cavity size was normal. Wall thickness was normal. Systolic function was normal. The estimated ejection fraction was 55%. Wall motion was normal; there were no regional wall motion abnormalities. Left ventricular diastolic function parameters were normal. - Aortic valve: There was no stenosis. - Mitral valve: No significant regurgitation. - Right ventricle: The cavity size was normal. Systolic function was normal. - Pulmonary arteries: PA peak pressure: 31mm Hg (S). - Inferior vena cava: The vessel was normal in size; the respirophasic diameter changes were in the normal range (= 50%); findings are consistent with normal central venous pressure. Impressions: - Normal LV size and systolic function, EF 55%. Normal diastolic function. Normal RV size and systolic function. No significant valvular abnormalities.  Ct Angio Chest W/cm &/or Wo Cm 02/21/2011   Clinical Data: Severe chest pain and back pain.  Nausea, vomiting, and diarrhea.  Concern for dissection in the chest.  Findings: Unenhanced images demonstrate normal caliber thoracic aorta.  No significant  vascular calcification.  No evidence of penetrating ulcer or intramural hematoma.  Images obtained after contrast administration demonstrate normal caliber thoracic aorta.  No evidence of dissection.  Great vessels demonstrate normal branching pattern and are patent.  Visualized central pulmonary arteries demonstrate no focal filling defects. Slight increased density in the anterior mediastinum consistent with residual thymus tissue.  Scattered lymph nodes in the chest are not pathologically enlarged.  The esophagus is decompressed. No abnormal mediastinal fluid collections.  No pleural effusions. Dependent atelectasis in the lungs.  No focal airspace consolidation or interstitial disease.  No pneumothorax.  Normal alignment of the lumbar vertebrae.  IMPRESSION: No evidence of aortic aneurysm or dissection.  No acute process demonstrated in the chest.   History of Present Illness: 26 y.o. male w/ PMHx significant for tobacco abuse, family history of Marfan Syndrome (sister died at 62 from aortic rupture), no prior known cardiac history who presented to [facility] on 02/21/11 with complaints of sudden onset central chest pain.  Chest pain was described as severe, sharp, worse with deep inspiration and lying down, improved with sitting up, that had been present for the past 16-18 hours. Pt denied associated shortness of breath, diphoresis, nausea, vomiting, orthopnea, PND, LEE, syncope, or palpitations. Pt reported recovering from a GI illness with nausea, vomiting and diarrhea over the course of the past 3-4 days. Pt denied fevers, chills, night sweats, or other complaints.  Hospital Course: In the ED, troponins and CKMB were mildly elevated, proBNP 1446, EKG revealed sinus tachycardia, PVCs w/o classic changes consistent with pericarditis (PR depression or diffuse STE). CXR was without acute cardiopulmonary abnormalities. CTA chest without evidence of aortic aneurysm or  dissection. He was admitted for  further evaluation and treatment.  Echocardiogram on 02/22/11 revealed normal LV size and systolic function, EF 55%, normal diastolic function, normal RV size and systolic function, and no significant valvular abnormalities. Cardiac enzymes were cycled and trended down to normal. He had resolution of his chest pain with morphine.  He was seen and evaluated by Dr. Eden Emms who felt she was stable for discharge home with plans for follow up as scheduled below.  Discharge Vitals: Blood pressure 121/71, pulse 91, temperature 97.8 F (36.6 C), temperature source Oral, resp. rate 17, height 6\' 3"  (1.905 m), weight 175 lb 7.8 oz (79.6 kg), SpO2 99.00%.  Labs: Component Value Date   WBC 11.2* 02/21/2011   HGB 13.2 02/21/2011   HCT 37.8* 02/21/2011   MCV 85.1 02/21/2011   PLT 150 02/21/2011    Lab 02/21/11 0159  NA 140  K 3.6  CL 104  CO2 28  BUN 4*  CREATININE 0.69  CALCIUM 9.1  GLUCOSE 98   Basename 02/21/11 1842 02/21/11 1320 02/21/11 0817 02/21/11 0159  CKTOTAL 64 70 95 104  CKMB 3.7 4.4* 5.7* 6.2*  TROPONINI <0.30 <0.30 0.38* 0.71*   Component Value Date   CHOL 97 02/22/2011   HDL 29* 02/22/2011   LDLCALC 53 02/22/2011   TRIG 73 02/22/2011    02/21/2011 03:57  Pro B Natriuretic peptide (BNP) 1446.0 (H)     02/21/2011 08:17  TSH 3.255   Discharge Medications   Medication List  As of 02/22/2011  5:58 PM   STOP taking these medications         naproxen sodium 220 MG tablet         TAKE these medications         amphetamine-dextroamphetamine 20 MG tablet   Commonly known as: ADDERALL   Take 20 mg by mouth every morning.      divalproex 500 MG 24 hr tablet   Commonly known as: DEPAKOTE ER   Take 500 mg by mouth 2 (two) times daily.      multivitamin Tabs   Take 1 tablet by mouth daily.      venlafaxine 75 MG tablet   Commonly known as: EFFEXOR   Take 75 mg by mouth 2 (two) times daily.      vitamin C 500 MG tablet   Commonly known as: ASCORBIC ACID   Take 500 mg by  mouth 2 (two) times daily.            Disposition   Discharge Orders    Future Orders Please Complete By Expires   Increase activity slowly        Follow-up Information    Follow up with BADGER,MICHAEL C, MD. Schedule an appointment as soon as possible for a visit in 2 weeks.      Follow up with [facility]. (Our office will call you with your appointment information)    Contact information:   [facility] [address] [place] [address] [phone]          Outstanding Labs/Studies: None  Duration of Discharge Encounter: Greater than 30 minutes including physician and PA time.  Signed, Hatsuko Bizzarro PA-C 02/22/2011, 5:58 PM

## 2011-02-22 NOTE — Progress Notes (Signed)
Pt discharged to home per MD order. Pt alert and orient with no complaints of chest pain. Pt received all discharge instructions and medication information.  Efraim Kaufmann

## 2011-02-22 NOTE — Progress Notes (Signed)
Patient ID: Brandon Vaughn, male   DOB: 23-Mar-1985, 26 y.o.   MRN: [id number] Subjective:  Feels much better no pain  Objective:  Vital Signs in the last 24 hours: Temp:  [98 F (36.7 C)-98.8 F (37.1 C)] 98 F (36.7 C) (01/30 0500) Pulse Rate:  [87-105] 87  (01/30 0500) Resp:  [18-20] 20  (01/30 0500) BP: (100-115)/(66-68) 103/66 mmHg (01/30 0500) SpO2:  [97 %-100 %] 97 % (01/30 0500) Weight:  [79.6 kg (175 lb 7.8 oz)] 79.6 kg (175 lb 7.8 oz) (01/30 0500)  Intake/Output from previous day: 01/29 0701 - 01/30 0700 In: 483 [P.O.:480; I.V.:3] Out: -  Intake/Output from this shift:    Physical Exam: Ill appearing young man, NAD HEENT: Unremarkable Neck:  No JVD, no thyromegally Lungs:  Clear with no wheezes. HEART:  Regular rate rhythm, no murmurs, no rubs, no clicks. S4 gallop is present. Abd:  Flat, positive bowel sounds, no organomegally, no rebound, no guarding Ext:  2 plus pulses, no edema, no cyanosis, no clubbing Skin:  No rashes no nodules Neuro:  CN II through XII intact, motor grossly intact  Lab Results:  Basename 02/21/11 0817 02/21/11 0159  WBC 11.2* 11.0*  HGB 13.2 12.3*  PLT 150 132*    Basename 02/21/11 0817 02/21/11 0159  NA -- 140  K -- 3.6  CL -- 104  CO2 -- 28  GLUCOSE -- 98  BUN -- 4*  CREATININE 0.77 0.69    Basename 02/21/11 1842 02/21/11 1320  TROPONINI <0.30 <0.30   Hepatic Function Panel No results found for this basename: PROT,ALBUMIN,AST,ALT,ALKPHOS,BILITOT,BILIDIR,IBILI in the last 72 hours  Basename 02/22/11 0518  CHOL 97   No results found for this basename: PROTIME in the last 72 hours  Imaging: Ct Angio Chest W/cm &/or Wo Cm  02/21/2011  *RADIOLOGY REPORT*  Clinical Data: Severe chest pain and back pain.  Nausea, vomiting, and diarrhea.  Concern for dissection in the chest.  CT ANGIOGRAPHY CHEST  Technique:  Multidetector CT imaging of the chest using the standard protocol during bolus administration of intravenous contrast.  Multiplanar reconstructed images including MIPs were obtained and reviewed to evaluate the vascular anatomy.  Contrast: 50mL OMNIPAQUE IOHEXOL 350 MG/ML IV SOLN  Comparison: None.  Findings: Unenhanced images demonstrate normal caliber thoracic aorta.  No significant vascular calcification.  No evidence of penetrating ulcer or intramural hematoma.  Images obtained after contrast administration demonstrate normal caliber thoracic aorta.  No evidence of dissection.  Great vessels demonstrate normal branching pattern and are patent.  Visualized central pulmonary arteries demonstrate no focal filling defects. Slight increased density in the anterior mediastinum consistent with residual thymus tissue.  Scattered lymph nodes in the chest are not pathologically enlarged.  The esophagus is decompressed. No abnormal mediastinal fluid collections.  No pleural effusions. Dependent atelectasis in the lungs.  No focal airspace consolidation or interstitial disease.  No pneumothorax.  Normal alignment of the lumbar vertebrae.  IMPRESSION: No evidence of aortic aneurysm or dissection.  No acute process demonstrated in the chest.  Original Report Authenticated By: Marlon Pel, M.D.    Cardiac Studies: Tele - NSR with PVC's. Assessment/Plan:  1. Chest pain - Atypical improved related to nausea and vohmiting.  CT normal.  Echo being done this am ? Etiology of elevated BNP.  NO acute ECG changes  Possible D/C latter today if echo is normal.    Charlton Haws 02/22/2011, 7:57 AM

## 2011-02-22 NOTE — Progress Notes (Signed)
  Echocardiogram 2D Echocardiogram has been performed.  Brandon Vaughn 02/22/2011, 8:46 AM

## 2011-02-24 ENCOUNTER — Telehealth: Payer: Self-pay | Admitting: Internal Medicine

## 2011-02-24 NOTE — Telephone Encounter (Signed)
New problem Pt said he was seen in er and needs a note for work

## 2011-02-24 NOTE — Telephone Encounter (Signed)
Fu call Pt's mother is calling back again and wants to know if he is supposed to be working please call back

## 2011-02-24 NOTE — Telephone Encounter (Signed)
F/U  Patient wife Revonda Standard (2nd Call)  Wants return call regarding return to work note for patient. Patient doesn't know if he is suppose to be back at work.  Please call wife on hm#

## 2011-02-24 NOTE — Telephone Encounter (Signed)
Called pt with msg/  apology for late return call. Need him to call back to triage and i left my extension number so I can be reached.

## 2011-02-24 NOTE — Telephone Encounter (Signed)
Received call from patient's mom who was upset at delay of answer. I apologized on our behalf. I reviewed data. Patient wasn't told when to go back to work but so long as he is feeling well I told them okay to return to work 02/27/11. They will come in Monday to pick up work note if we can make available. Mother will call on Monday. I also left VM on scheduler voicemail to make sure f/u had been scheduled.  Dayna Dunn PA-C

## 2011-02-27 ENCOUNTER — Telehealth: Payer: Self-pay | Admitting: Internal Medicine

## 2011-02-27 ENCOUNTER — Encounter: Payer: Self-pay | Admitting: *Deleted

## 2011-02-27 NOTE — Telephone Encounter (Signed)
Pt will pick up needs, letter stating when he can rtn to work and any limitations he may have, pls call when ready

## 2011-02-27 NOTE — Telephone Encounter (Signed)
Letter ready and up front to pick up

## 2011-03-08 ENCOUNTER — Ambulatory Visit (INDEPENDENT_AMBULATORY_CARE_PROVIDER_SITE_OTHER): Payer: BC Managed Care – PPO | Admitting: Internal Medicine

## 2011-03-08 VITALS — BP 110/66 | HR 95 | Ht 75.0 in | Wt 188.4 lb

## 2011-03-08 DIAGNOSIS — R0789 Other chest pain: Secondary | ICD-10-CM

## 2011-03-08 NOTE — Assessment & Plan Note (Signed)
His chest pain has resolved. I discussed the pathophysiology of chest pain due to esophageal erosions after prolonged vomiting. He will follow up with [place] on an as needed basis. In addition, the patient's family has a history of sudden cardiac death due to Marfan syndrome. The patient has no evidence of Marfan syndrome by 2-D echo. I recommended this be repeated in approximately 5 years and he is instructed to call us to have this scheduled at that time.

## 2011-03-08 NOTE — Progress Notes (Signed)
HPI Mr. Brum returns today for followup. He is a 26 year old man with a history of chest pain for which he was admitted to the hospital after having a prolonged and severe episode of nausea and vomiting. It was thought that his chest pain was due to esophageal erosion and spasm. He was treated with morphine and hydrogen pump suppression and Maalox. Since then he has done well. His pain has resolved. He has had no recurrent nausea or vomiting. He denies chest pain or shortness of breath. Allergies  Allergen Reactions  . Bee Venom Anaphylaxis  . Codeine   . Hydrocodone      Current Outpatient Prescriptions  Medication Sig Dispense Refill  . amphetamine-dextroamphetamine (ADDERALL) 20 MG tablet Take 20 mg by mouth 3 (three) times daily.       . divalproex (DEPAKOTE ER) 500 MG 24 hr tablet Take 500 mg by mouth 2 (two) times daily.      . multivitamin (ONE-A-DAY MEN'S) TABS Take 1 tablet by mouth daily.        Marland Kitchen venlafaxine (EFFEXOR) 75 MG tablet Take 75 mg by mouth 2 (two) times daily.      . vitamin C (ASCORBIC ACID) 500 MG tablet Take 500 mg by mouth 2 (two) times daily.           Past Medical History  Diagnosis Date  . Bipolar 1 disorder   . Pneumonia   . Gout     ROS:   All systems reviewed and negative except as noted in the HPI.   Past Surgical History  Procedure Date  . Tonsillectomy   . Appendectomy   . Arthroscopic repair acl   . Lymph node removal   . Tympanoplasty   . Knee surgery      Family History  Problem Relation Age of Onset  . Heart disease Sister   . Coronary artery disease Other   . Bipolar disorder Other      History   Social History  . Marital Status: Single    Spouse Name: N/A    Number of Children: N/A  . Years of Education: N/A   Occupational History  . Not on file.   Social History Main Topics  . Smoking status: Current Everyday Smoker -- 1.0 packs/day for 7 years    Types: Cigarettes  . Smokeless tobacco: Never Used  . Alcohol  Use: No  . Drug Use: No  . Sexually Active: Yes   Other Topics Concern  . Not on file   Social History Narrative  . No narrative on file     BP 110/66  Pulse 95  Ht 6\' 3"  (1.905 m)  Wt 85.458 kg (188 lb 6.4 oz)  BMI 23.55 kg/m2  Physical Exam:  Well appearing young man, NAD HEENT: Unremarkable Neck:  No JVD, no thyromegally Lungs:  Clear with no wheezes, rales, or rhonchi. HEART:  Regular rate rhythm, no murmurs, no rubs, no clicks Abd:  soft, positive bowel sounds, no organomegally, no rebound, no guarding Ext:  2 plus pulses, no edema, no cyanosis, no clubbing Skin:  No rashes no nodules Neuro:  CN II through XII intact, motor grossly intact her   Assess/Plan:

## 2011-03-08 NOTE — Patient Instructions (Signed)
Your physician recommends that you schedule a follow-up appointment as needed  

## 2012-12-18 IMAGING — CT CT ANGIO CHEST
2 of 8 series · 11 of 36 positions shown · IV contrast (omnipaque)
Comparison: None.

CLINICAL DATA: Severe chest pain and back pain.  Nausea, vomiting,
and diarrhea.  Concern for dissection in the chest.

CT ANGIOGRAPHY CHEST
TECHNIQUE: Multidetector CT imaging of the chest using the
standard protocol during bolus administration of intravenous
contrast. Multiplanar reconstructed images including MIPs were
obtained and reviewed to evaluate the vascular anatomy.
Contrast: 50mL OMNIPAQUE IOHEXOL 350 MG/ML IV SOLN

[Series 6: lungs · axial · 0.68mm/px · z∈[-324,-50]mm · 10 of 169 slices shown]
[im 16/169  lung]
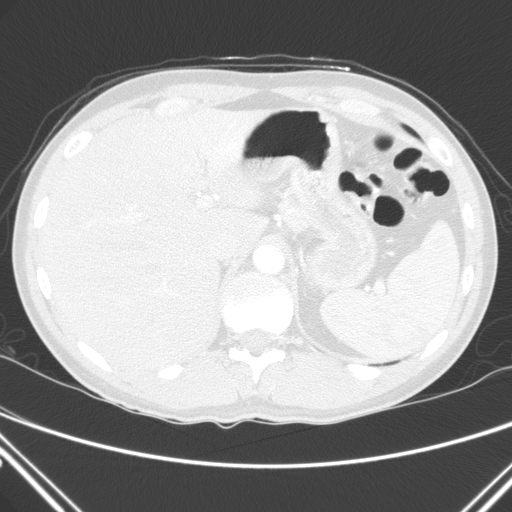
[im 31/169  mediastinal]
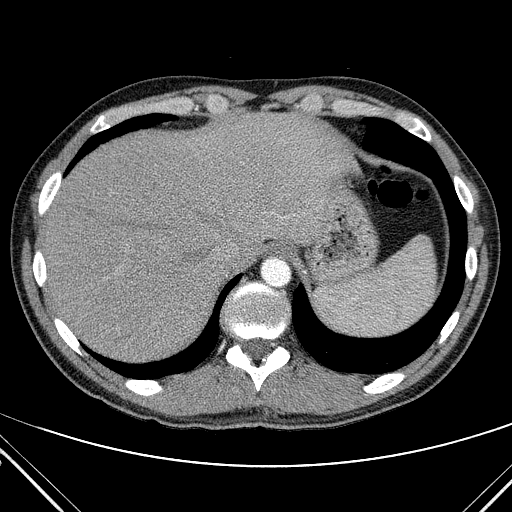
[im 46/169  lung]
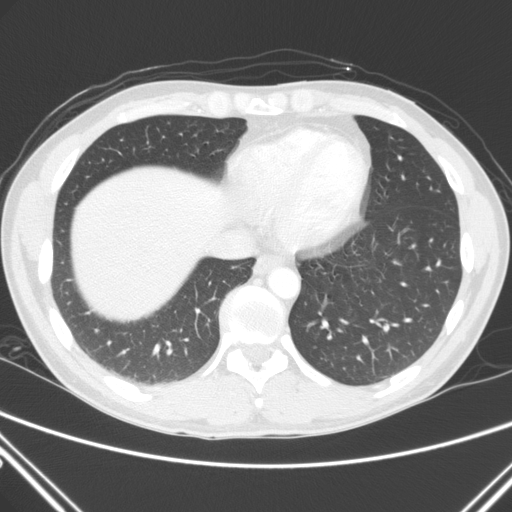
[im 62/169  mediastinal]
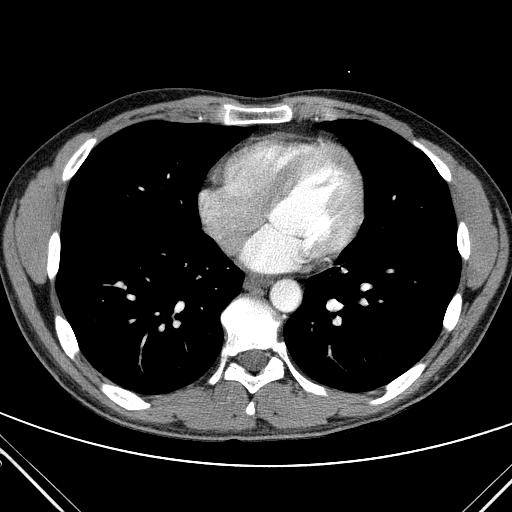
[im 77/169  lung]
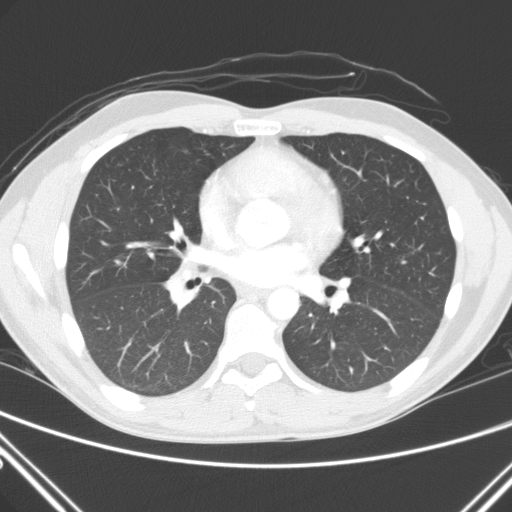
[im 92/169  mediastinal]
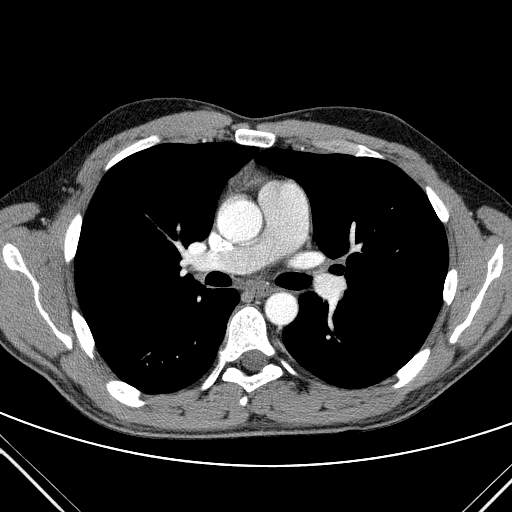
[im 107/169  lung]
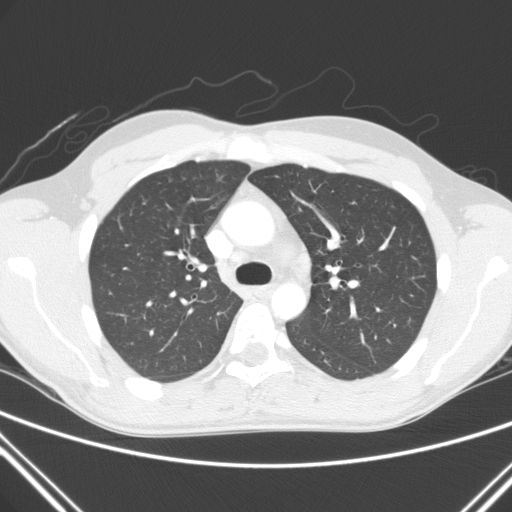
[im 123/169  mediastinal]
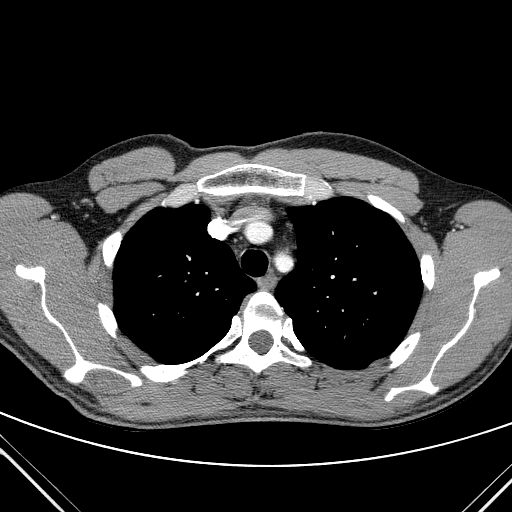
[im 138/169  lung]
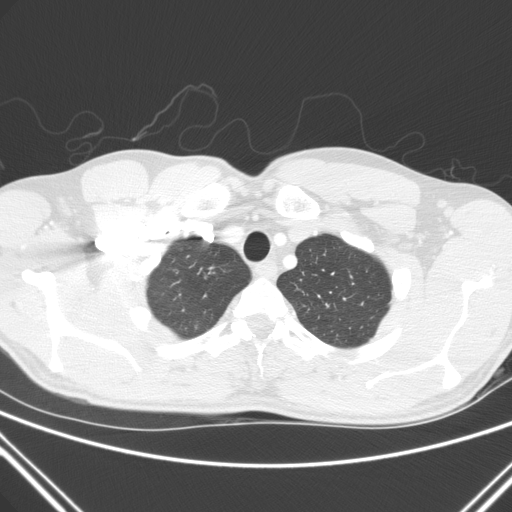
[im 153/169  mediastinal]
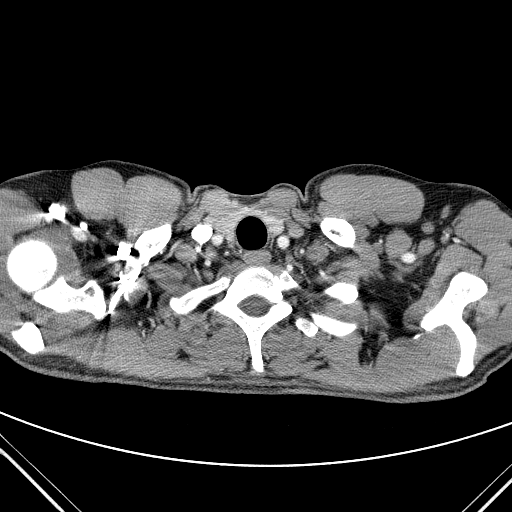

[mpr, coronal mpr, coronal · coronal · 0.68mm/px · 1 of 117 slices shown]
[im 59/117  mediastinal]
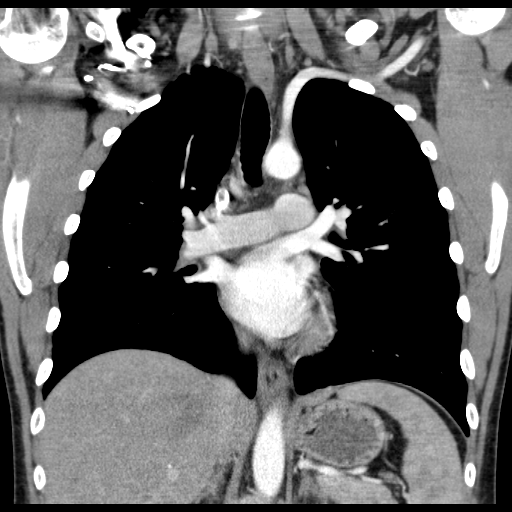

[11 of 36 positions shown; findings below may reference images not displayed]

FINDINGS: Unenhanced images demonstrate normal caliber thoracic
aorta.  No significant vascular calcification.  No evidence of
penetrating ulcer or intramural hematoma.

Images obtained after contrast administration demonstrate normal
caliber thoracic aorta.  No evidence of dissection.  Great vessels
demonstrate normal branching pattern and are patent.  Visualized
central pulmonary arteries demonstrate no focal filling defects.
Slight increased density in the anterior mediastinum consistent
with residual thymus tissue.  Scattered lymph nodes in the chest
are not pathologically enlarged.  The esophagus is decompressed.
No abnormal mediastinal fluid collections.  No pleural effusions.
Dependent atelectasis in the lungs.  No focal airspace
consolidation or interstitial disease.  No pneumothorax.  Normal
alignment of the lumbar vertebrae.
IMPRESSION: No evidence of aortic aneurysm or dissection.  No acute process
demonstrated in the chest.

## 2013-04-18 ENCOUNTER — Encounter (HOSPITAL_COMMUNITY): Payer: Self-pay | Admitting: *Deleted

## 2013-04-18 ENCOUNTER — Inpatient Hospital Stay (HOSPITAL_COMMUNITY)
Admission: RE | Admit: 2013-04-18 | Discharge: 2013-04-22 | DRG: 885 | Discharge status: Against Medical Advice | Payer: Federal, State, Local not specified - Other | Attending: Psychiatry | Admitting: Psychiatry

## 2013-04-18 DIAGNOSIS — R5383 Other fatigue: Secondary | ICD-10-CM

## 2013-04-18 DIAGNOSIS — Z79899 Other long term (current) drug therapy: Secondary | ICD-10-CM

## 2013-04-18 DIAGNOSIS — R45851 Suicidal ideations: Secondary | ICD-10-CM

## 2013-04-18 DIAGNOSIS — F322 Major depressive disorder, single episode, severe without psychotic features: Secondary | ICD-10-CM | POA: Diagnosis present

## 2013-04-18 DIAGNOSIS — F3162 Bipolar disorder, current episode mixed, moderate: Secondary | ICD-10-CM

## 2013-04-18 DIAGNOSIS — F17200 Nicotine dependence, unspecified, uncomplicated: Secondary | ICD-10-CM | POA: Diagnosis present

## 2013-04-18 DIAGNOSIS — F332 Major depressive disorder, recurrent severe without psychotic features: Principal | ICD-10-CM | POA: Diagnosis present

## 2013-04-18 DIAGNOSIS — R5381 Other malaise: Secondary | ICD-10-CM | POA: Diagnosis present

## 2013-04-18 LAB — RAPID URINE DRUG SCREEN, HOSP PERFORMED
Amphetamines: POSITIVE — AB
Barbiturates: NOT DETECTED
Benzodiazepines: NOT DETECTED
COCAINE: NOT DETECTED
Opiates: NOT DETECTED
Tetrahydrocannabinol: NOT DETECTED

## 2013-04-18 LAB — CBC WITH DIFFERENTIAL/PLATELET
BASOS ABS: 0 10*3/uL (ref 0.0–0.1)
BASOS PCT: 1 % (ref 0–1)
Eosinophils Absolute: 0.2 10*3/uL (ref 0.0–0.7)
Eosinophils Relative: 4 % (ref 0–5)
HCT: 43 % (ref 39.0–52.0)
HEMOGLOBIN: 15.5 g/dL (ref 13.0–17.0)
Lymphocytes Relative: 36 % (ref 12–46)
Lymphs Abs: 1.5 10*3/uL (ref 0.7–4.0)
MCH: 30.6 pg (ref 26.0–34.0)
MCHC: 36 g/dL (ref 30.0–36.0)
MCV: 85 fL (ref 78.0–100.0)
MONOS PCT: 16 % — AB (ref 3–12)
Monocytes Absolute: 0.7 10*3/uL (ref 0.1–1.0)
NEUTROS PCT: 43 % (ref 43–77)
Neutro Abs: 1.8 10*3/uL (ref 1.7–7.7)
PLATELETS: 172 10*3/uL (ref 150–400)
RBC: 5.06 MIL/uL (ref 4.22–5.81)
RDW: 12 % (ref 11.5–15.5)
WBC: 4.2 10*3/uL (ref 4.0–10.5)

## 2013-04-18 LAB — COMPREHENSIVE METABOLIC PANEL
ALK PHOS: 52 U/L (ref 39–117)
ALT: 24 U/L (ref 0–53)
AST: 23 U/L (ref 0–37)
Albumin: 4 g/dL (ref 3.5–5.2)
BILIRUBIN TOTAL: 0.3 mg/dL (ref 0.3–1.2)
BUN: 9 mg/dL (ref 6–23)
CHLORIDE: 100 meq/L (ref 96–112)
CO2: 29 mEq/L (ref 19–32)
Calcium: 9.9 mg/dL (ref 8.4–10.5)
Creatinine, Ser: 0.98 mg/dL (ref 0.50–1.35)
GFR calc Af Amer: >90 mL/min (ref >90)
Glucose, Bld: 94 mg/dL (ref 70–99)
POTASSIUM: 4.9 meq/L (ref 3.7–5.3)
Sodium: 138 mEq/L (ref 137–147)
Total Protein: 6.8 g/dL (ref 6.0–8.3)

## 2013-04-18 LAB — URINALYSIS, ROUTINE W REFLEX MICROSCOPIC
BILIRUBIN URINE: NEGATIVE
Glucose, UA: NEGATIVE mg/dL
HGB URINE DIPSTICK: NEGATIVE
KETONES UR: NEGATIVE mg/dL
Leukocytes, UA: NEGATIVE
Nitrite: NEGATIVE
PH: 7.5 (ref 5.0–8.0)
Protein, ur: NEGATIVE mg/dL
SPECIFIC GRAVITY, URINE: 1.021 (ref 1.005–1.030)
Urobilinogen, UA: 1 mg/dL (ref 0.0–1.0)

## 2013-04-18 MED ORDER — MAGNESIUM HYDROXIDE 400 MG/5ML PO SUSP: 30.0000 mL | Freq: Every day | ORAL | Status: DC | PRN

## 2013-04-18 MED ORDER — INFLUENZA VAC SPLIT QUAD 0.5 ML IM SUSP
0.5000 mL | INTRAMUSCULAR | Status: DC
  Filled 2013-04-18: qty 0.5

## 2013-04-18 MED ORDER — HYDROXYZINE HCL 25 MG PO TABS
25.0000 mg | ORAL_TABLET | Freq: Four times a day (QID) | ORAL | Status: DC | PRN
  Filled 2013-04-18: qty 1

## 2013-04-18 MED ORDER — ADULT MULTIVITAMIN W/MINERALS CH
1.0000 | ORAL_TABLET | Freq: Every day | ORAL | Status: DC
  Administered 2013-04-18 – 2013-04-22 (×5): 1 via ORAL
  Filled 2013-04-18 (×8): qty 1

## 2013-04-18 MED ORDER — AMPHETAMINE-DEXTROAMPHETAMINE 10 MG PO TABS
20.0000 mg | ORAL_TABLET | Freq: Every day | ORAL | Status: DC
  Administered 2013-04-19 – 2013-04-22 (×4): 20 mg via ORAL
  Filled 2013-04-18 (×4): qty 2

## 2013-04-18 MED ORDER — ACETAMINOPHEN 325 MG PO TABS: 650.0000 mg | ORAL_TABLET | Freq: Four times a day (QID) | ORAL | Status: DC | PRN

## 2013-04-18 MED ORDER — ALUM & MAG HYDROXIDE-SIMETH 200-200-20 MG/5ML PO SUSP: 30.0000 mL | ORAL | Status: DC | PRN

## 2013-04-18 MED ORDER — TRAZODONE HCL 50 MG PO TABS
50.0000 mg | ORAL_TABLET | Freq: Every evening | ORAL | Status: DC | PRN
  Filled 2013-04-18 (×3): qty 1

## 2013-04-18 MED ORDER — VITAMIN C 500 MG PO TABS
500.0000 mg | ORAL_TABLET | Freq: Two times a day (BID) | ORAL | Status: DC
  Administered 2013-04-18 – 2013-04-22 (×8): 500 mg via ORAL
  Filled 2013-04-18 (×2): qty 1
  Filled 2013-04-18: qty 2
  Filled 2013-04-18 (×10): qty 1

## 2013-04-18 MED ORDER — PNEUMOCOCCAL VAC POLYVALENT 25 MCG/0.5ML IJ INJ: 0.5000 mL | INJECTION | INTRAMUSCULAR | Status: DC

## 2013-04-18 MED ORDER — VENLAFAXINE HCL 75 MG PO TABS
75.0000 mg | ORAL_TABLET | Freq: Two times a day (BID) | ORAL | Status: DC
  Administered 2013-04-18: 75 mg via ORAL
  Filled 2013-04-18 (×2): qty 1
  Filled 2013-04-18: qty 2
  Filled 2013-04-18 (×2): qty 1

## 2013-04-18 MED ORDER — DIVALPROEX SODIUM ER 500 MG PO TB24
1000.0000 mg | ORAL_TABLET | Freq: Every day | ORAL | Status: DC
  Administered 2013-04-18: 1000 mg via ORAL
  Filled 2013-04-18 (×3): qty 2

## 2013-04-18 NOTE — Progress Notes (Signed)
Patient ID: Brandon SalinesJames D Vaughn, male   DOB: 09/27/1985, 28 y.o.   MRN: [id number] Admit Note. D. Patient here for [facility]. He reports increasing depression, trouble concentrating, racing thoughts at times and states '' I've been here before and got completely out of my mind. Where I was so manic I was psychotic, I can tell I've not been feeling right for the past three months and I feel I need to get checked out. There are a lot of things going on with my family and girlfriend, but I'm having trouble with concentration and racing thoughts '' Patient denies any SI/HI/A/V Hallucinations on admission. He reports being compliant with home medications. Skin assessed and patient searched no contraband found. Patient oriented to unit. Low fall risk . Discussed above information and patients home medications with Renata Capriceonrad NP - NP to place orders for patient. Will continue to monitor q 15 minutes for safety.

## 2013-04-18 NOTE — Tx Team (Signed)
Initial Interdisciplinary Treatment Plan  PATIENT STRENGTHS: (choose at least two) Ability for insight Average or above average intelligence Communication skills General fund of knowledge Motivation for treatment/growth Physical Health Special hobby/interest Supportive family/friends  PATIENT STRESSORS: Financial difficulties Marital or family conflict   PROBLEM LIST: Problem List/Patient Goals Date to be addressed Date deferred Reason deferred Estimated date of resolution  Depression/altered Mood 04-18-13   dc  Suicide risk 04-18-13                                                DISCHARGE CRITERIA:  Ability to meet basic life and health needs Improved stabilization in mood, thinking, and/or behavior Motivation to continue treatment in a less acute level of care Reduction of life-threatening or endangering symptoms to within safe limits Verbal commitment to aftercare and medication compliance  PRELIMINARY DISCHARGE PLAN: Attend aftercare/continuing care group Participate in family therapy Return to previous living arrangement Return to previous work or school arrangements  PATIENT/FAMIILY INVOLVEMENT: This treatment plan has been presented to and reviewed with the patient, Brandon Vaughn, and/or family member, .  The patient and family have been given the opportunity to ask questions and make suggestions.  Malva LimesStrader, Mckinze Poirier 04/18/2013, 4:11 PM

## 2013-04-18 NOTE — BH Assessment (Signed)
Tele Assessment Note   Brandon Vaughn is an 28 y.o. male that was assessed this day as a walk-in to [facility].  Pt presented with his mother.  She was present during the assessment per his request.  Pt reports increased depression over the past 3-4 months.  Pt reports SI, stating, "I have never been this depressed.  I just want to give up." Pt unable to contract for safety at this time.  Pt has a hx of a diagnosis of Bipolar Disorder.  He had his first psychotic break at age 35.  He has been hospitalized twice at [facility] in the past for this.  Pt has been on medications that have kept him stable for 10 years.  He takes Depakote, Adderall, and Effexor.  Pt receives outpatient treatment at [facility] in [place].  Pt reports several neurovegetative sx of depression, including not grooming or bathing.  Pt admits to being unable to function.  He has been missing work and had to leave work due to a crying spell earlier this week.  Current stressors are conflict between his mother and his girlfriend that do not agree on the care of his three year old daughter.  Pt recently moved out of his mother's home after living at home all of his life and moved in with his girlfriend and her parents for 3 months.  It did not work out for the pt and he moved back home with his mother.  Pt has a hx of manic episodes with AVH and delusions.  However, it has been years since pt experienced psychosis and currently denies.  He denies SA or HI.  Pt is cooperative, and knows he needs help to stabilize his sx.  He has good insight into his mental illness and takes his medications as prescribed.  Pt endorses sx of anxiety.  Consulted with Nanine Means, NP, who accepted pt to 500 Hall bed to Dr. Elsie Saas @ 1500.  Pt and pt's mother in agreement with disposition.  Berneice Heinrich, [facility], and TTS staff updated.  Pt to be admitted to [facility], as his vitals were taken by Thayer Ohm, RN, and per Nanine Means, were in normal limits.  Therefore, pt did not have to go for  medical clearance.    Axis I: 296.8 Bipolar Disorder NOS Axis II: Deferred Axis III:  Past Medical History  Diagnosis Date  . Bipolar 1 disorder   . Pneumonia   . Gout    Axis IV: occupational problems, other psychosocial or environmental problems and problems with primary support group Axis V: 21-30 behavior considerably influenced by delusions or hallucinations OR serious impairment in judgment, communication OR inability to function in almost all areas  Past Medical History:  Past Medical History  Diagnosis Date  . Bipolar 1 disorder   . Pneumonia   . Gout     Past Surgical History  Procedure Laterality Date  . Tonsillectomy    . Appendectomy    . Arthroscopic repair acl    . Lymph node removal    . Tympanoplasty    . Knee surgery      Family History:  Family History  Problem Relation Age of Onset  . Heart disease Sister   . Coronary artery disease Other   . Bipolar disorder Other     Social History:  reports that he has been smoking Cigarettes.  He has a 7 pack-year smoking history. He has never used smokeless tobacco. He reports that he does not drink alcohol  or use illicit drugs.  Additional Social History:  Alcohol / Drug Use Pain Medications: none Prescriptions: see med list Over the Counter: see med list History of alcohol / drug use?: No history of alcohol / drug abuse Longest period of sobriety (when/how long):  (na) Negative Consequences of Use:  (na) Withdrawal Symptoms:  (na)  CIWA: CIWA-Ar BP: 115/81 mmHg Pulse Rate: 99 Nausea and Vomiting: no nausea and no vomiting Tactile Disturbances: none Tremor: no tremor Auditory Disturbances: not present Paroxysmal Sweats: no sweat visible Visual Disturbances: not present Anxiety: no anxiety, at ease Headache, Fullness in Head: none present Agitation: normal activity Orientation and Clouding of Sensorium: oriented and can do serial additions CIWA-Ar Total: 0 COWS: Clinical Opiate Withdrawal Scale  (COWS) Resting Pulse Rate: Pulse Rate 80 or below Sweating: No report of chills or flushing Restlessness: Able to sit still Pupil Size: Pupils pinned or normal size for room light Bone or Joint Aches: Not present Runny Nose or Tearing: Not present GI Upset: No GI symptoms Tremor: No tremor Yawning: No yawning Anxiety or Irritability: None Gooseflesh Skin: Skin is smooth COWS Total Score: 0  Allergies:  Allergies  Allergen Reactions  . Bee Venom Anaphylaxis  . Codeine   . Hydrocodone     Home Medications:  Medications Prior to Admission  Medication Sig Dispense Refill  . amphetamine-dextroamphetamine (ADDERALL) 20 MG tablet Take 20 mg by mouth 3 (three) times daily.       . multivitamin (ONE-A-DAY MEN'S) TABS Take 1 tablet by mouth daily.        Marland Kitchen. venlafaxine (EFFEXOR) 75 MG tablet Take 75 mg by mouth 2 (two) times daily.      . vitamin C (ASCORBIC ACID) 500 MG tablet Take 500 mg by mouth 2 (two) times daily.        . divalproex (DEPAKOTE ER) 500 MG 24 hr tablet Take 1,000 mg by mouth at bedtime.         OB/GYN Status:  No LMP for male patient.  General Assessment Data Location of Assessment: [facility] Assessment Services Is this a Tele or Face-to-Face Assessment?: Face-to-Face Is this an Initial Assessment or a Re-assessment for this encounter?: Initial Assessment Living Arrangements: Spouse/significant other;Children;Other relatives Can pt return to current living arrangement?: Yes Admission Status: Voluntary Is patient capable of signing voluntary admission?: Yes Transfer from: Home Referral Source: Self/Family/Friend  Medical Screening Exam [facility] Walk-in ONLY) Medical Exam completed: No Reason for MSE not completed: Other: [facility]  [facility] Living Arrangements: Spouse/significant other;Children;Other relatives Name of Psychiatrist: Dr. Betti Cruzeddy - [facility] Name of Therapist: Larita FifeLynn - [facility]  Education Status Is patient currently in school?:  No Highest grade of school patient has completed: Associate's Degree Name of school: [employer] Contact person: self  Risk to self Suicidal Ideation: Yes-Currently Present Suicidal Intent: No-Not Currently/Within Last 6 Months Is patient at risk for suicide?: Yes Suicidal Plan?: No Access to Means: No What has been your use of drugs/alcohol within the last 12 months?: pt denies Previous Attempts/Gestures: Yes How many times?: 1 Other Self Harm Risks: pt denies Triggers for Past Attempts: Other (Comment) (Manic Episode - psychosis) Intentional Self Injurious Behavior: None Family Suicide History: No Recent stressful life event(s): Turmoil (Comment);Other (Comment) (SI, depression, decreased functioning) Persecutory voices/beliefs?: No Depression: Yes Depression Symptoms: Despondent;Tearfulness;Isolating;Fatigue;Guilt;Loss of interest in usual pleasures;Feeling worthless/self pity;Feeling angry/irritable Substance abuse history and/or treatment for substance abuse?: No Suicide prevention information given to non-admitted patients: Not applicable  Risk to  Others Homicidal Ideation: No Thoughts of Harm to Others: No Current Homicidal Intent: No Current Homicidal Plan: No Access to Homicidal Means: No Identified Victim: pt denies History of harm to others?: No Assessment of Violence: None Noted Violent Behavior Description: na - pt calm cooperative Does patient have access to weapons?: No Criminal Charges Pending?: No Does patient have a court date: No  Psychosis Hallucinations: None noted (Has hx of AVH when manic) Delusions: Grandiose;Unspecified (In past when manic, none currently)  Mental Status Report Appear/Hygiene: Disheveled Eye Contact: Good Motor Activity: Freedom of movement;Unremarkable Speech: Logical/coherent Level of Consciousness: Alert Mood: Depressed;Anxious Affect: Anxious;Blunted Anxiety Level: Moderate Thought Processes: Coherent;Relevant Judgement:  Unimpaired Orientation: Person;Place;Time;Situation Obsessive Compulsive Thoughts/Behaviors: None  Cognitive Functioning Concentration: Decreased Memory: Recent Intact;Remote Intact IQ: Average Insight: Poor Impulse Control: Fair Appetite: Poor Weight Loss:  (pt unsure if he has lost weight) Weight Gain: 0 Sleep: No Change Total Hours of Sleep:  (6-9 hrs per night) Vegetative Symptoms: Not bathing;Decreased grooming  ADLScreening [facility] Assessment Services) Patient's cognitive ability adequate to safely complete daily activities?: Yes Patient able to express need for assistance with ADLs?: Yes Independently performs ADLs?: Yes (appropriate for developmental age)  Prior Inpatient Therapy Prior Inpatient Therapy: Yes Prior Therapy Dates: 2004, 2006 Prior Therapy Facilty/Provider(s): [facility] Reason for Treatment: Psychosis - Bipolar Disorder  Prior Outpatient Therapy Prior Outpatient Therapy: Yes Prior Therapy Dates: Current x 10 years  Prior Therapy Facilty/Provider(s): [facility]-Dr. Betti Cruz currently, Dr. Katrinka Blazing and Larita Fife, a therapist in the past Reason for Treatment: Med mgnt and therapy  ADL Screening (condition at time of admission) Patient's cognitive ability adequate to safely complete daily activities?: Yes Is the patient deaf or have difficulty hearing?: No Does the patient have difficulty seeing, even when wearing glasses/contacts?: No Does the patient have difficulty concentrating, remembering, or making decisions?: No Patient able to express need for assistance with ADLs?: Yes Does the patient have difficulty dressing or bathing?: No Independently performs ADLs?: Yes (appropriate for developmental age) Does the patient have difficulty walking or climbing stairs?: No Weakness of Legs: None Weakness of Arms/Hands: None  Home Assistive Devices/Equipment Home Assistive Devices/Equipment: None  Therapy Consults (therapy consults require a physician order) PT  Evaluation Needed: No OT Evalulation Needed: No SLP Evaluation Needed: No Abuse/Neglect Assessment (Assessment to be complete while patient is alone) Physical Abuse: Denies Verbal Abuse: Denies Sexual Abuse: Denies Exploitation of patient/patient's resources: Denies Self-Neglect: Denies Values / Beliefs Cultural Requests During Hospitalization: None Spiritual Requests During Hospitalization: None Consults Spiritual Care Consult Needed: No Social Work Consult Needed: No Merchant navy officer (For Healthcare) Advance Directive: Patient does not have advance directive Pre-existing out of facility DNR order (yellow form or pink MOST form): No Nutrition Screen- [facility] Adult/WL/AP Patient's home diet: Regular  Additional Information 1:1 In Past 12 Months?: No CIRT Risk: No Elopement Risk: No Does patient have medical clearance?: No     Disposition:  Disposition Initial Assessment Completed for this Encounter: Yes Disposition of Patient: Inpatient treatment program Type of inpatient treatment program: Adult (Pt accepted [facility])  Casimer Lanius, MS, [facility] Licensed Professional Counselor Triage Specialist   04/18/2013 4:14 PM

## 2013-04-18 NOTE — Progress Notes (Signed)
[facility] Group Notes:  (Nursing/MHT/Case Management/Adjunct)  Date:  04/18/2013  Time:  9:20 PM  Type of Therapy:  Group Therapy  Participation Level:  Minimal  Participation Quality:  Appropriate  Affect:  Appropriate  Cognitive:  Appropriate  Insight:  Appropriate  Engagement in Group:  Developing/Improving  Modes of Intervention:  Socialization and Support  Summary of Progress/Problems: Pt. Was oriented to the unit.  Pt. Rated his energy level as 6.  Pt. Stated he was seeking outpatient therapy to help with relapse prevention.  Sondra ComeWilson, Zhoey Blackstock J 04/18/2013, 9:20 PM

## 2013-04-18 NOTE — H&P (Signed)
Psychiatric Admission Assessment Adult  Patient Identification:  MARCIA Vaughn Date of Evaluation:  04/18/2013 Chief Complaint:  bipolar disorder History of Present Illness:: Brandon Vaughn is an 28 y.o. male that was assessed this day as a walk-in to [facility]. Pt presented with his mother. She was present during the assessment per his request. Pt reports increased depression over the past 3-4 months. Pt reports SI, stating, "I have never been this depressed. I just want to give up." Pt unable to contract for safety at this time. Pt has a hx of a diagnosis of Bipolar Disorder. He had his first psychotic break at age 28. He has been hospitalized twice at [facility] in the past for this. Pt has been on medications that have kept him stable for 10 years. He takes Depakote, Adderall, and Effexor. Pt receives outpatient treatment at [facility] in [place]. Pt reports several neurovegetative sx of depression, including not grooming or bathing. Pt admits to being unable to function. He has been missing work and had to leave work due to a crying spell earlier this week. Current stressors are conflict between his mother and his girlfriend that do not agree on the care of his three year old daughter. Pt recently moved out of his mother's home after living at home all of his life and moved in with his girlfriend and her parents for 3 months. It did not work out for the pt and he moved back home with his mother. Pt has a hx of manic episodes with AVH and delusions. However, it has been years since pt experienced psychosis and currently denies. He denies SA or HI. Pt is cooperative, and knows he needs help to stabilize his sx. He has good insight into his mental illness and takes his medications as prescribed. Pt endorses sx of anxiety.   During admission assessment, pt rates anxiety at 2/10 and depression at 5/10. Denies SI, HI, and AVH, and contracts for safety, but does report hopelessness and recent inability to contract for  safety. Pt states that his last "manic episode was in 2006 and none since". Pt states that he is extremely lethargic upon waking and cannot function due to low energy until he takes his Adderall dose in the morning. Pt states he has been concerned about his Depakote possibly being too strong for him causing him to wake up very tired (adjusted and level drawn). Pt states that recent stressors are primarily the living situation, the relationship with his fiancee and pressure getting married, concerns about getting their own place, etc. Pt states he is feeling overwhelmed by these stressors. Pt reports that he has seen a therapist but it has been "1.5-64yrs" and it was "Larita Fife at Dr. Jae Dire office". Pt expresses interest in wanting to resume outpatient therapy upon discharge, but states he had "to wait so long to get appointments, sometimes even a month".   Elements:  Location:  Generalized, Inpatient [facility]. Quality:  Worsening. Severity:  Severe. Timing:  Constant. Duration:  Chronic with recent exacerbation for past 4 months. Context:  Pt is overwhelmed with situations as stated above.. Associated Signs/Synptoms: Depression Symptoms:  depressed mood, anhedonia, psychomotor retardation, fatigue, feelings of worthlessness/guilt, difficulty concentrating, hopelessness, anxiety, loss of energy/fatigue, (Hypo) Manic Symptoms:  Denies Anxiety Symptoms:  Excessive Worry, Psychotic Symptoms:  Denies PTSD Symptoms: Denies Total Time spent with patient: Greater than 30 minutes  Psychiatric Specialty Exam: Physical Exam  Review of Systems  Constitutional: Negative.   HENT: Negative.   Eyes: Negative.  Respiratory: Negative.   Cardiovascular: Negative.   Gastrointestinal: Negative.   Genitourinary: Negative.   Musculoskeletal: Negative.   Skin: Negative.   Neurological: Negative.   Endo/Heme/Allergies: Negative.   Psychiatric/Behavioral: Positive for depression and suicidal ideas. The patient  is nervous/anxious.     Blood pressure 115/81, pulse 99, temperature 98.1 F (36.7 C), temperature source Oral, resp. rate 18, height 6\' 3"  (1.905 m), weight 87.091 kg (192 lb), SpO2 99.00%.Body mass index is 24 kg/(m^2).  General Appearance: Casual  Eye Contact::  Good  Speech:  Clear and Coherent  Volume:  Normal  Mood:  Depressed  Affect:  Depressed  Thought Process:  Coherent  Orientation:  Full (Time, Place, and Person)  Thought Content:  WDL  Suicidal Thoughts:  No  Homicidal Thoughts:  No  Memory:  Immediate;   Good Recent;   Good Remote;   Good  Judgement:  Fair  Insight:  Fair  Psychomotor Activity:  Decreased  Concentration:  Good  Recall:  Good  Fund of Knowledge:Good  Language: Good  Akathisia:  NA  Handed:    AIMS (if indicated):     Assets:  Communication Skills Desire for Improvement Resilience  Sleep:       Musculoskeletal: Strength & Muscle Tone: within normal limits Gait & Station: normal Patient leans: N/A  Past Psychiatric History: Diagnosis: Bipolar, Depressed, Social Anxiety  Hospitalizations: [facility] (2x, last in 2006)  Outpatient Care: Dr. Betti Cruz  Substance Abuse Care: Denies  Self-Mutilation: Denies  Suicidal Attempts: Denies  Violent Behaviors: Punched wall (2014), no violence towards others   Past Medical History:   Past Medical History  Diagnosis Date  . Bipolar 1 disorder   . Pneumonia   . Gout    None. Allergies:   Allergies  Allergen Reactions  . Bee Venom Anaphylaxis  . Codeine   . Hydrocodone    PTA Medications: Prescriptions prior to admission  Medication Sig Dispense Refill  . amphetamine-dextroamphetamine (ADDERALL) 20 MG tablet Take 20 mg by mouth 3 (three) times daily.       . multivitamin (ONE-A-DAY MEN'S) TABS Take 1 tablet by mouth daily.        Marland Kitchen venlafaxine (EFFEXOR) 75 MG tablet Take 75 mg by mouth 2 (two) times daily.      . vitamin C (ASCORBIC ACID) 500 MG tablet Take 500 mg by mouth 2 (two) times daily.         . divalproex (DEPAKOTE ER) 500 MG 24 hr tablet Take 1,000 mg by mouth at bedtime.         Previous Psychotropic Medications:  Medication/Dose  Risperdal (unknown dose)               Substance Abuse History in the last 12 months:  no  Consequences of Substance Abuse: NA  Social History:  reports that he has been smoking Cigarettes.  He has a 7 pack-year smoking history. He has never used smokeless tobacco. He reports that he does not drink alcohol or use illicit drugs. Additional Social History: Pain Medications: none Prescriptions: see med list Over the Counter: see med list History of alcohol / drug use?: No history of alcohol / drug abuse Longest period of sobriety (when/how long):  (na) Negative Consequences of Use:  (na) Withdrawal Symptoms:  (na)                    Current Place of Residence:  [place] Place of Birth:  [place] Family Members: Mom, Dad, sisters, fiance  Marital Status:  Engaged Children:  Sons:  Daughters: 1 (3yo) Relationships: Engaged Education:  AS in Scientist, forensic Educational Problems/Performance: Denies Religious Beliefs/Practices: Christian History of Abuse (Emotional/Phsycial/Sexual): Denies Armed forces technical officer; Transport planner History:  Denies Legal History: Denies Hobbies/Interests: Clinical cytogeneticist Games, Therapist, music, Best boy (TV)  Family History:   Family History  Problem Relation Age of Onset  . Heart disease Sister   . Coronary artery disease Other   . Bipolar disorder Other     No results found for this or any previous visit (from the past 72 hour(s)). Psychological Evaluations:  Assessment:   DSM5:  Depressive Disorders:  Major Depressive Disorder - Severe (296.23)  AXIS I:  Bipolar, Depressed and Social Anxiety AXIS II:  Deferred AXIS III:   Past Medical History  Diagnosis Date  . Bipolar 1 disorder   . Pneumonia   . Gout    AXIS IV:  other psychosocial or environmental problems and problems related to social  environment AXIS V:  41-50 serious symptoms  Treatment Plan/Recommendations:   Review of chart, vital signs, medications, and notes.  1-Individual and group therapy  2-Medication management for depression and anxiety: Medications reviewed with the patient and she stated no untoward effects.  -Increase Effexor to 225mg  daily total (150mg  AM, 75mg  PM) -Decrease Depakote to 750mg  qhs (mood stabilization)  3-Coping skills for depression, anxiety  4-Continue crisis stabilization and management  5-Address health issues--monitoring vital signs, stable  6-Treatment plan in progress to prevent relapse of depression and anxiety  Treatment Plan Summary: Daily contact with patient to assess and evaluate symptoms and progress in treatment Medication management Current Medications:  Current Facility-Administered Medications  Medication Dose Route Frequency Provider Last Rate Last Dose  . acetaminophen (TYLENOL) tablet 650 mg  650 mg Oral Q6H PRN Beau Fanny, FNP      . alum & mag hydroxide-simeth (MAALOX/MYLANTA) 200-200-20 MG/5ML suspension 30 mL  30 mL Oral Q4H PRN Beau Fanny, FNP      . [START ON 04/19/2013] amphetamine-dextroamphetamine (ADDERALL) tablet 20 mg  20 mg Oral Daily Beau Fanny, FNP      . divalproex (DEPAKOTE ER) 24 hr tablet 1,000 mg  1,000 mg Oral QHS Beau Fanny, FNP      . hydrOXYzine (ATARAX/VISTARIL) tablet 25 mg  25 mg Oral Q6H PRN Beau Fanny, FNP      . [START ON 04/19/2013] influenza vac split quadrivalent PF (FLUARIX) injection 0.5 mL  0.5 mL Intramuscular Tomorrow-1000 Nehemiah Settle, MD      . magnesium hydroxide (MILK OF MAGNESIA) suspension 30 mL  30 mL Oral Daily PRN Beau Fanny, FNP      . multivitamin with minerals tablet 1 tablet  1 tablet Oral Daily Beau Fanny, FNP   1 tablet at 04/18/13 1719  . [START ON 04/19/2013] pneumococcal 23 valent vaccine (PNU-IMMUNE) injection 0.5 mL  0.5 mL Intramuscular Tomorrow-1000 Nehemiah Settle, MD      . traZODone (DESYREL) tablet 50 mg  50 mg Oral QHS PRN Beau Fanny, FNP      . venlafaxine (EFFEXOR) tablet 75 mg  75 mg Oral BID Beau Fanny, FNP   75 mg at 04/18/13 1719  . vitamin C (ASCORBIC ACID) tablet 500 mg  500 mg Oral BID Beau Fanny, FNP   500 mg at 04/18/13 1719    Observation Level/Precautions:  15 minute checks  Laboratory:  Labs resulted, reviewed, and stable at this time.   Psychotherapy:  Group therapy, individual therapy, psychoeducation  Medications:  See MAR above  Consultations: None    Discharge Concerns: None    Estimated LOS: 5-7 days  Other:  N/A   I certify that inpatient services furnished can reasonably be expected to improve the patient's condition.   Beau FannyWithrow, John C, WashingtonFNP-BC 3/27/20157:06 PM  I have personally seen the patient and agreed with the findings and involved in the treatment plan. Kathryne SharperSyed Eleesha Purkey, MD

## 2013-04-19 DIAGNOSIS — F411 Generalized anxiety disorder: Secondary | ICD-10-CM

## 2013-04-19 DIAGNOSIS — F3130 Bipolar disorder, current episode depressed, mild or moderate severity, unspecified: Secondary | ICD-10-CM

## 2013-04-19 DIAGNOSIS — R45851 Suicidal ideations: Secondary | ICD-10-CM

## 2013-04-19 LAB — VALPROIC ACID LEVEL: VALPROIC ACID LVL: 62.9 ug/mL (ref 50.0–100.0)

## 2013-04-19 LAB — TSH: TSH: 1.273 u[IU]/mL (ref 0.350–4.500)

## 2013-04-19 MED ORDER — VENLAFAXINE HCL 75 MG PO TABS
150.0000 mg | ORAL_TABLET | Freq: Every day | ORAL | Status: DC
  Administered 2013-04-19 – 2013-04-22 (×4): 150 mg via ORAL
  Filled 2013-04-19 (×6): qty 2

## 2013-04-19 MED ORDER — DIVALPROEX SODIUM ER 250 MG PO TB24
750.0000 mg | ORAL_TABLET | Freq: Every day | ORAL | Status: DC
  Administered 2013-04-19 – 2013-04-21 (×3): 750 mg via ORAL
  Filled 2013-04-19 (×5): qty 3

## 2013-04-19 MED ORDER — VENLAFAXINE HCL 75 MG PO TABS
75.0000 mg | ORAL_TABLET | Freq: Every day | ORAL | Status: DC
  Administered 2013-04-19 – 2013-04-21 (×3): 75 mg via ORAL
  Filled 2013-04-19 (×5): qty 1

## 2013-04-19 NOTE — Progress Notes (Signed)
D.  Pt pleasant on approach, requested evening scheduled medication, did not feel that he would need anything for sleep.  Denies complaints at this time.  Denies SI/HI/hallucinations at present.  A.  Support and encouragement offered  R.  Pt remains safe on unit, will continue to monitor.

## 2013-04-19 NOTE — [facility] Suicide Risk Assessment (Signed)
Suicide Risk Assessment  Admission Assessment     Nursing information obtained from:  Patient Demographic factors:  Male;Adolescent or young adult;Caucasian;Low socioeconomic status Current Mental Status:  NA Loss Factors:  NA Historical Factors:  Impulsivity Risk Reduction Factors:  Sense of responsibility to family;Living with another person, especially a relative Total Time spent with patient: 45 minutes  CLINICAL FACTORS:   Bipolar Disorder:   Mixed State  Psychiatric Specialty Exam:     Blood pressure 89/59, pulse 94, temperature 97.4 F (36.3 C), temperature source Oral, resp. rate 16, height 6\' 3"  (1.905 m), weight 192 lb (87.091 kg), SpO2 99.00%.Body mass index is 24 kg/(m^2).  General Appearance: Casual and Guarded  Eye Contact::  Minimal  Speech:  Slow  Volume:  Decreased  Mood:  Anxious, Depressed and Dysphoric  Affect:  Constricted and Depressed  Thought Process:  Loose  Orientation:  Full (Time, Place, and Person)  Thought Content:  Rumination and Suicidal thinking  Suicidal Thoughts:  Yes.  without intent/plan  Homicidal Thoughts:  No  Memory:  Immediate;   Fair Recent;   Fair Remote;   Fair  Judgement:  Impaired  Insight:  Lacking  Psychomotor Activity:  Decreased  Concentration:  Poor  Recall:  FiservFair  Fund of Knowledge:Fair  Language: Fair  Akathisia:  No  Handed:  Right  AIMS (if indicated):     Assets:  Desire for Improvement  Sleep:  Number of Hours: 6.25   Musculoskeletal: Strength & Muscle Tone: within normal limits Gait & Station: normal Patient leans: N/A  COGNITIVE FEATURES THAT CONTRIBUTE TO RISK:  Polarized thinking Thought constriction (tunnel vision)    SUICIDE RISK:   Severe:  Frequent, intense, and enduring suicidal ideation, specific plan, no subjective intent, but some objective markers of intent (i.e., choice of lethal method), the method is accessible, some limited preparatory behavior, evidence of impaired self-control, severe  dysphoria/symptomatology, multiple risk factors present, and few if any protective factors, particularly a lack of social support.  PLAN OF CARE:  I certify that inpatient services furnished can reasonably be expected to improve the patient's condition.  Brandon Lebon T. 04/19/2013, 9:36 AM

## 2013-04-19 NOTE — Progress Notes (Signed)
Did not attend group 

## 2013-04-19 NOTE — Progress Notes (Signed)
Writer spoke with patient 1:1 and he reports being admitted earlier today. Writer introduced and informed him of medications ordered. He reports that his depression has increased and he feels that his medications are no longer working and is hopeful that this is the problem and his doctor will be able to try different meds. He currently denies si/hi/a/v hallucinations. Support and encouragement given. Safety maintained on unit with 15 min checks.

## 2013-04-19 NOTE — [facility] Group Notes (Signed)
[facility] LCSW Group Therapy  04/19/2013 2:53 PM  Type of Therapy:  Group Therapy  Participation Level:  Active  Participation Quality:  Attentive, Sharing and Supportive  Affect:  Appropriate  Cognitive:  Alert and Oriented  Insight:  Developing/Improving  Engagement in Therapy:  Engaged  Modes of Intervention:  Education, Exploration and Problem-solving  Summary of Progress/Problems:  Today's group consisted of a conversation around Supportive Framework: What is a supportive framework? What does it look like feel like and how do I discern it from and unhealthy non-supportive network? Learn how to cope when supports are not helpful and don't support you. Discuss what to do when your family/friends are not supportive.  Fayrene FearingJames was very engaged in discussion and insightful when talking about supports. He was able to process the difficulty in people changing and the fear of failure in relations to letting negative supports loose and engaging in new supports.  He shares his fear of change and failure has crippled him and not allowed him to make concise decisions or have control of his life.  He process how this has lead into a deep depression with multiple layers.  Supports are important to BraytonJames as she looks for positive reinforcement and when he does not have encouragement he questions himself and falls into negative self talk.  Raye SorrowCoble, Eashan Schipani N 04/19/2013, 2:53 PM

## 2013-04-19 NOTE — Progress Notes (Signed)
Brandon Vaughn is pleasant and cooperative and is seen interacting with his peers on the unit. He attends his groups, he is engaged in his poc and he contracts with this Clinical research associatewriter to stay safe. A He completes his self inventory and on it he writes his depression and hopelessness are "5/5" and he denies SI within the previous 24 hrs and says his DC plans are to " med changes". R Safety is in place and poc cont.

## 2013-04-19 NOTE — Progress Notes (Signed)
Psychoeducational Group Note  Date: 04/19/2013 Time:  1015  Group Topic/Focus:  Identifying Needs:   The focus of this group is to help patients identify their personal needs that have been historically problematic and identify healthy behaviors to address their needs.  Participation Level:  Active  Participation Quality:  Appropriate  Affect:  Appropriate  Cognitive:  Oriented  Insight:  Improving  Engagement in Group:  Engaged  Additional Comments:    Khylin Gutridge A  

## 2013-04-19 NOTE — Progress Notes (Signed)
.  Psychoeducational Group Note    Date: 04/19/2013 Time: 0930   Goal Setting Purpose of Group: To be able to set a goal that is measurable and that can be accomplished in one day Participation Level: Did not attend  Dione HousekeeperJudge, Yue Flanigan A

## 2013-04-19 NOTE — [facility] Counselor (Signed)
Adult Comprehensive Assessment  Patient ID: Brandon Vaughn, male   DOB: 06/08/1985, 28 y.o.   MRN: [id number]  Information Source: Information source: Patient  Current Stressors:  Family Relationships: patient feels caught in the middle of his mother and fiance due to past conflict.  patient struggles leaving his parents has he has never lived alone or been independent lacking skills of adulthood Surveyor, quantity / Lack of resources (include bankruptcy): lack of finaces for his parents as his mother is disabled and father recently was demoted. 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 and always caring for his family Housing / Lack of housing: patient recently moved in with finace family and dealing with adjustment of not being with his mother  Living/Environment/Situation:  Living Arrangements: Non-relatives/Friends Living conditions (as described by patient or guardian): Patient reports living with fiance's family is very different as her father is Hotel manager and strict along with increased structure How long has patient lived in current situation?: 4 months What is atmosphere in current home: Supportive  Family History:  Marital status: Long term relationship Long term relationship, how long?: 4 years What types of issues is patient dealing with in the relationship?: patient is put in the middle of choosing between his mother and finace. patient's fiance has been stealing from others and himself, they broke up for 6-7 months and patient is still dealing with past hurt and trust issues.   Additional relationship information: Patient has never lived without his mother and father. patient has become comfortable with others doing things for him and not being independent causing an internal conflict of how to move forward and not be afraid of failing Does patient have children?: Yes How many children?: 1 How is patient's relationship with their children?: patient lives with his 87  year old daughter whom he reports he wants to be the best father he can be to her  Childhood History:  By whom was/is the patient raised?: Both parents Additional childhood history information: patient mother has probable dx of bipolar Description of patient's relationship with caregiver when they were a child: patient is the baby in the family and the one everyone has always taken care of.  patient was close with mother, but felt emotionally neglected as she had depression and was not mentally around.  patient and father were close, but father is a man of few words and less emotions Patient's description of current relationship with people who raised him/her: Mother and father still alive. patient harbors some resientment to his mother as he feels she has set him up to fail. he reports he loves his mother, but feels she never taught him how to be a man, but more just took care of things.  Father is still involved and been the most consistent person in his life. Does patient have siblings?: Yes Number of Siblings: 3 Description of patient's current relationship with siblings: 2 sisters and a brother.  one sister lives in [place] and he is closest to this sibling, talking frequently and her being supportive.  patient's other sister has bipolar and she does not stay on her medications making him stay away from her due to her mental health poor compliance.  patient does talk to his other brother, but not frequently.  All siblings are out of the home and living independently. Did patient suffer any verbal/emotional/physical/sexual abuse as a child?: No Did patient suffer from severe childhood neglect?: No Has patient ever been sexually abused/assaulted/raped as an adolescent  or adult?: No Was the patient ever a victim of a crime or a disaster?: No Witnessed domestic violence?: No Has patient been effected by domestic violence as an adult?: No  Education:  Highest grade of school patient has  completed: 2 years of college.  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 patient been employed?: 2 1/2 years Patient's job has been impacted by current illness: Yes Describe how patient's job has been impacted: Patient reports he cannot focus at his job due to pressures at home and increased stressed  What is the longest time patient has a held a job?: 3 1/2 years- 4 years Where was the patient employed at that time?: [employer] Has patient ever been in the [employer]?: No Has patient ever served in combat?: No  Financial Resources:   Financial resources: Income from employment Does patient have a representative payee or guardian?: No  Alcohol/Substance Abuse:   What has been your use of drugs/alcohol within the last 12 months?: none reported If attempted suicide, did drugs/alcohol play a role in this?: No Alcohol/Substance Abuse Treatment Hx: Denies past history If yes, describe treatment: NA Has alcohol/substance abuse ever caused legal problems?: No  Social Support System:   Patient's Community Support System: Good Describe Community Support System: patient has increaed family support and a best friend Type of faith/religion: none reported How does patient's faith help to cope with current illness?: none reported  Leisure/Recreation:   Leisure and Hobbies: hunting, fishing, and being outdoors.  Strengths/Needs:   What things does the patient do well?: being a caregiver In what areas does patient struggle / problems for patient: fear of failure and indpendent skills  Discharge Plan:   Does patient have access to transportation?: Yes Will patient be returning to same living situation after discharge?: Yes Currently receiving community mental health services:  No If no, would patient like referral for services when discharged?: Yes (What county?) ([facility] Dr. Betti Cruz and wanting a therapist (use to [facility], wanting to return)) Does patient have financial barriers related to discharge medications?: No  Summary/Recommendations:      NAYTHEN HEIKKILA is an 28 y.o. male that was assessed this day as a walk-in to [facility]. Pt presented with his mother. She was present during the assessment per his request. Pt reports increased depression over the past 3-4 months. Pt reports SI, stating, "I have never been this depressed. I just want to give up." Pt unable to contract for safety at this time. Pt has a hx of a diagnosis of Bipolar Disorder. He had his first psychotic break at age 66. He has been hospitalized twice at [facility] in the past for this. Pt has been on medications that have kept him stable for 10 years. He takes Depakote, Adderall, and Effexor. Pt receives outpatient treatment at [facility] in [place]. Pt reports several neurovegetative sx of depression, including not grooming or bathing. Pt admits to being unable to function. He has been missing work and had to leave work due to a crying spell earlier this week. Current stressors are conflict between his mother and his girlfriend that do not agree on the care of his three year old daughter. Pt recently moved out of his mother's home after living at home all of his life and moved in  with his girlfriend and her parents for 3 months.  Brandon Vaughn current depression stems from feeling overwhelmed with current relationships in his life and managing his anxiety around failing. Patient admitted to acute hospital for groups:psyco-educational, processing, and aftercare planning.   Raye SorrowCoble, Brandon Vaughn N. 04/19/2013

## 2013-04-20 NOTE — Progress Notes (Signed)
D Fayrene FearingJames is OOB UAL on the unit today..he tolerates this well. HE is pleasant and cooeprative. He attends his groups.is engaged in his poc. He takes his emds as ordered and he completes his self invnetory and on it he writes he denies SI within the previous 24 hrs and he rates his depression and ahopelessness "3/4".   A His affect is sad, depressed but less flat today.   R Safety is in place and poc moves forward.

## 2013-04-20 NOTE — Progress Notes (Signed)
Adult Psychoeducational Group Note  Date:  04/20/2013 Time:  9:17 PM  Group Topic/Focus:  Wrap-Up Group:   The focus of this group is to help patients review their daily goal of treatment and discuss progress on daily workbooks.  Participation Level:  Active  Participation Quality:  Appropriate  Affect:  Appropriate  Cognitive:  Appropriate  Insight: Appropriate  Engagement in Group:  Engaged  Modes of Intervention:  Discussion  Additional Comm   The patient expressed that he had a good day.The patient said that he felt better about his medications.  Octavio Mannshigpen, Destiney Sanabia Lee 04/20/2013, 9:17 PM

## 2013-04-20 NOTE — Progress Notes (Signed)
Psychoeducational Group Note  Date:  04/20/2013 Time:  1015  Group Topic/Focus:  Making Healthy Choices:   The focus of this group is to help patients identify negative/unhealthy choices they were using prior to admission and identify positive/healthier coping strategies to replace them upon discharge.  Participation Level:  Active  Participation Quality:  Appropriate  Affect:  Appropriate  Cognitive:  Appropriate  Insight:  Improving  Engagement in Group:  Engaged  Additional Comments:    Feven Alderfer A 04/20/2013  

## 2013-04-20 NOTE — [facility] Group Notes (Signed)
[facility] LCSW Group Therapy  04/20/2013   1:15 PM   Type of Therapy:  Group Therapy  Participation Level:  Active  Participation Quality:  Oriented and Attentive  Affect:  Flat and Depressed  Cognitive:  Alert and Appropriate  Insight:  Developing/Improving and Engaged  Engagement in Therapy:  Developing/Improving and Engaged  Modes of Intervention:  Clarification, Confrontation, Discussion, Education, Exploration, Limit-setting, Orientation, Problem-solving, Rapport Building, Dance movement psychotherapisteality Testing, Socialization and Support  Summary of Progress/Problems: Today's group topic was avoiding self sabotage and enabling behaviors. Group members were asked to define self sabotage and enabling and provide examples. Group members were then asked to discuss unhealthy relationships and how to have positive healthy boundaries with those that enable. Group members were asked to process how communicating needs and establishing a plan to change the above identified behavior.  Pt shared that he didn't feel this topic applied to him and doesn't think he has any self sabotaging behaviors.  Pt shared that his issue is trying to not please everyone else and not being happy in the process.  Peers confronted pt on this, stating that this was self sabotaging in a way.  Pt states that he feels he can't leave the relationship he is in due to his child and was able to discuss ways he can set boundaries in this relationship.  Pt actively participated and was engaged in group discussion.    Brandon IvanChelsea Horton, LCSW 04/20/2013 2:02 PM

## 2013-04-20 NOTE — Progress Notes (Signed)
Psychoeducational Group Note  Date: 04/20/2013 Time:  0930  Group Topic/Focus:  Gratefulness:  The focus of this group is to help patients identify what two things they are most grateful for in their lives. What helps ground them and to center them on their work to their recovery.  Participation Level:  Active  Participation Quality:  Appropriate  Affect:  Flat  Cognitive:  Oriented  Insight:  Improving  Engagement in Group:  Engaged  Additional Comments:    Lonney Revak A   

## 2013-04-20 NOTE — Progress Notes (Signed)
[facility] MD Progress Note  04/20/2013 10:36 PM Brandon Vaughn  MRN:  [id number] Subjective:   Brandon Vaughn is an 28 y.o. male that was assessed this day as a walk-in to [facility]. Pt presented with his mother. She was present during the assessment per his request. Pt reports increased depression over the past 3-4 months. Pt reports SI, stating, "I have never been this depressed. I just want to give up." Pt unable to contract for safety at this time. Pt has a hx of a diagnosis of Bipolar Disorder. He had his first psychotic break at age 78. He has been hospitalized twice at [facility] in the past for this. Pt has been on medications that have kept him stable for 10 years. He takes Depakote, Adderall, and Effexor. Pt receives outpatient treatment at [facility] in [place]. Pt reports several neurovegetative sx of depression, including not grooming or bathing. Pt admits to being unable to function. He has been missing work and had to leave work due to a crying spell earlier this week. Current stressors are conflict between his mother and his girlfriend that do not agree on the care of his three year old daughter. Pt recently moved out of his mother's home after living at home all of his life and moved in with his girlfriend and her parents for 3 months. It did not work out for the pt and he moved back home with his mother. Pt has a hx of manic episodes with AVH and delusions. However, it has been years since pt experienced psychosis and currently denies. He denies SA or HI. Pt is cooperative, and knows he needs help to stabilize his sx. He has good insight into his mental illness and takes his medications as prescribed. Pt endorses sx of anxiety.   During today's assessment, pt is minimizing depression and anxiety symptoms stating that "the medication changes are amazing and I feel so much better. The Depakote going down really helped a lot with my energy, I'm not bogged down or drowsy and I don't notice the drop in the Adderall.  The Effexor increase has helped my mood a lot to where I don't feel as much anxiety or depression". Pt states he has been happy, laughing, smiling, joking around and that he feels that the medications are great. Pt states that he wants to continue this protocol when he leaves so that he can take it to Dr. Betti Cruz to keep ordering/managing. Pt denies SI, HI, and AVH, contracts for safety.    Diagnosis:   DSM5:  Depressive Disorders:  Major Depressive Disorder - Severe (296.23) Total Time spent with patient: 20 minutes  Axis I: Major Depression, Recurrent severe Axis II: Deferred Axis III:  Past Medical History  Diagnosis Date  . Bipolar 1 disorder   . Pneumonia   . Gout    Axis IV: other psychosocial or environmental problems and problems related to social environment Axis V: 41-50 serious symptoms  ADL's:  Intact  Sleep: Fair  Appetite:  Good  Suicidal Ideation:  Denies Homicidal Ideation:  Denies AEB (as evidenced by):  Psychiatric Specialty Exam: Physical Exam  Review of Systems  Constitutional: Negative.   HENT: Negative.   Eyes: Negative.   Respiratory: Negative.   Cardiovascular: Negative.   Gastrointestinal: Negative.   Genitourinary: Negative.   Musculoskeletal: Negative.   Skin: Negative.   Neurological: Negative.   Endo/Heme/Allergies: Negative.   Psychiatric/Behavioral: Positive for depression. The patient is nervous/anxious.     Blood pressure 106/68,  pulse 85, temperature 97.3 F (36.3 C), temperature source Oral, resp. rate 18, height 6\' 3"  (1.905 m), weight 87.091 kg (192 lb), SpO2 99.00%.Body mass index is 24 kg/(m^2).  General Appearance: Casual  Eye Contact::  Good  Speech:  Clear and Coherent  Volume:  Normal  Mood:  Euthymic  Affect:  Appropriate and Congruent  Thought Process:  Coherent  Orientation:  Full (Time, Place, and Person)  Thought Content:  WDL  Suicidal Thoughts:  No  Homicidal Thoughts:  No  Memory:  Immediate;    Good Recent;   Good Remote;   Good  Judgement:  Fair  Insight:  Fair  Psychomotor Activity:  Normal  Concentration:  Good  Recall:  Good  Fund of Knowledge:Good  Language: Good  Akathisia:  NA  Handed:  Right  AIMS (if indicated):     Assets:  Communication Skills Desire for Improvement Financial Resources/Insurance Housing Leisure Time Physical Health Resilience Talents/Skills  Sleep:  Number of Hours: 3   Musculoskeletal: Strength & Muscle Tone: within normal limits Gait & Station: normal Patient leans: N/A  Current Medications: Current Facility-Administered Medications  Medication Dose Route Frequency Provider Last Rate Last Dose  . acetaminophen (TYLENOL) tablet 650 mg  650 mg Oral Q6H PRN Beau Fanny, FNP      . alum & mag hydroxide-simeth (MAALOX/MYLANTA) 200-200-20 MG/5ML suspension 30 mL  30 mL Oral Q4H PRN Beau Fanny, FNP      . amphetamine-dextroamphetamine (ADDERALL) tablet 20 mg  20 mg Oral Daily Beau Fanny, FNP   20 mg at 04/20/13 9147  . divalproex (DEPAKOTE ER) 24 hr tablet 750 mg  750 mg Oral QHS Beau Fanny, FNP   750 mg at 04/20/13 2126  . hydrOXYzine (ATARAX/VISTARIL) tablet 25 mg  25 mg Oral Q6H PRN Beau Fanny, FNP      . influenza vac split quadrivalent PF (FLUARIX) injection 0.5 mL  0.5 mL Intramuscular Tomorrow-1000 Nehemiah Settle, MD      . magnesium hydroxide (MILK OF MAGNESIA) suspension 30 mL  30 mL Oral Daily PRN Beau Fanny, FNP      . multivitamin with minerals tablet 1 tablet  1 tablet Oral Daily Beau Fanny, FNP   1 tablet at 04/20/13 0830  . pneumococcal 23 valent vaccine (PNU-IMMUNE) injection 0.5 mL  0.5 mL Intramuscular Tomorrow-1000 Nehemiah Settle, MD      . traZODone (DESYREL) tablet 50 mg  50 mg Oral QHS PRN Beau Fanny, FNP      . venlafaxine (EFFEXOR) tablet 150 mg  150 mg Oral Daily Beau Fanny, FNP   150 mg at 04/20/13 8295  . venlafaxine (EFFEXOR) tablet 75 mg  75 mg Oral QAC  supper Beau Fanny, FNP   75 mg at 04/20/13 1726  . vitamin C (ASCORBIC ACID) tablet 500 mg  500 mg Oral BID Beau Fanny, FNP   500 mg at 04/20/13 1726    Lab Results: No results found for this or any previous visit (from the past 48 hour(s)).  Physical Findings: AIMS: Facial and Oral Movements Muscles of Facial Expression: None, normal Lips and Perioral Area: None, normal Jaw: None, normal Tongue: None, normal,Extremity Movements Upper (arms, wrists, hands, fingers): None, normal Lower (legs, knees, ankles, toes): None, normal, Trunk Movements Neck, shoulders, hips: None, normal, Overall Severity Severity of abnormal movements (highest score from questions above): None, normal Incapacitation due to abnormal movements: None, normal Patient's awareness of abnormal  movements (rate only patient's report): No Awareness, Dental Status Current problems with teeth and/or dentures?: No Does patient usually wear dentures?: No  CIWA:  CIWA-Ar Total: 0 COWS:  COWS Total Score: 0  Treatment Plan Summary: Daily contact with patient to assess and evaluate symptoms and progress in treatment Medication management  Plan: Review of chart, vital signs, medications, and notes.  1-Individual and group therapy  2-Medication management for depression and anxiety: Medications reviewed with the patient and she stated no untoward effects, unchanged. 3-Coping skills for depression, anxiety  4-Continue crisis stabilization and management  5-Address health issues--monitoring vital signs, stable  6-Treatment plan in progress to prevent relapse of depression and anxiety  Medical Decision Making Problem Points:  Established problem, stable/improving (1), Review of last therapy session (1) and Review of psycho-social stressors (1) Data Points:  Review or order clinical lab tests (1) Review or order medicine tests (1) Review of medication regiment & side effects (2) Review of new medications or change  in dosage (2)  I certify that inpatient services furnished can reasonably be expected to improve the patient's condition.   Beau FannyWithrow, John C, FNP-BC 04/20/2013, 10:36 PM  I agreed with the findings, treatment and disposition plan of this patient. Kathryne SharperSyed Arfeen, MD

## 2013-04-20 NOTE — Progress Notes (Signed)
Writer spoke with patient 1:1 and he report that he feels less groggy since his depakote was decreased. He reports that he has had a good day and has been up in the dayroom watching tv. He denies si/hi/a/v hallucinations. He attended group and is compliant with his medication.  Safety maintained on unit with 15 min checks.

## 2013-04-21 NOTE — [facility] Group Notes (Signed)
[facility] LCSW Group Therapy  04/21/2013 1:15 PM   Type of Therapy:  Group Therapy  Participation Level:  Did Not Attend - CSW spoke with pt individually to see if he'd attend group.  Pt was observed under the covers.  Pt states that he's "pissed" today because he feels he should d/c and his depakote level can be checked outpatient.  Pt declined to come to any groups today, stating that he'd rather be "pissed" and stay to himself.    Reyes IvanChelsea Horton, LCSW 04/21/2013 2:59 PM

## 2013-04-21 NOTE — [facility] Suicide Risk Assessment (Signed)
[facility] INPATIENT:  Family/Significant Other Suicide Prevention Education  Suicide Prevention Education:  Education Completed; Sydnee LevansCheryl Safran - mother [phone]),  (name of family member/significant other) has been identified by the patient as the family member/significant other with whom the patient will be residing, and identified as the person(s) who will aid the patient in the event of a mental health crisis (suicidal ideations/suicide attempt).  With written consent from the patient, the family member/significant other has been provided the following suicide prevention education, prior to the and/or following the discharge of the patient.  The suicide prevention education provided includes the following:  Suicide risk factors  Suicide prevention and interventions  National Suicide Hotline telephone number  [facility] assessment telephone number  [facility] Emergency Assistance 911  [facility] and/or Residential Mobile Crisis Unit telephone number  Request made of family/significant other to:  Remove weapons (e.g., guns, rifles, knives), all items previously/currently identified as safety concern.    Remove drugs/medications (over-the-counter, prescriptions, illicit drugs), all items previously/currently identified as a safety concern.  The family member/significant other verbalizes understanding of the suicide prevention education information provided.  The family member/significant other agrees to remove the items of safety concern listed above.  Carmina MillerHorton, Makensey Rego Nicole 04/21/2013, 3:04 PM

## 2013-04-21 NOTE — Progress Notes (Signed)
Patient ID: Brandon Vaughn, male   DOB: [birth year], 28 y.o.   MRN: [id number] [facility] MD Progress Note  04/21/2013 11:16 AM HANDSOME ANGLIN  MRN:  [id number] Subjective:   Brandon Vaughn is an 28 y.o. male presented with his mother, reports increased depression over the past 3-4 months. Pt reports SI, stating, "I have never been this depressed. I just want to give up." Patient has a hx of a diagnosis of Bipolar Disorder. He had his first psychotic break at age 8. He has been hospitalized twice at [facility] in the past for this. Pt has been on medications that have kept him stable for 10 years. He takes Depakote, Adderall, and Effexor. Pt receives outpatient treatment at [facility] in [place]. Pt reports several neurovegetative sx of depression, including not grooming or bathing. Pt admits to being unable to function. He has been missing work and had to leave work due to a crying spell earlier this week. Current stressors are conflict between his mother and his girlfriend that do not agree on the care of his three year old daughter. Pt recently moved out of his mother's home after living at home all of his life and moved in with his girlfriend and her parents for 3 months. It did not work out for the pt and he moved back home with his mother. Pt has a hx of manic episodes with AVH and delusions. However, it has been years since pt experienced psychosis and currently denies. He denies SA or HI. Pt is cooperative, and knows he needs help to stabilize his sx. He has good insight into his mental illness and takes his medications as prescribed. Pt endorses sx of anxiety.   During today's assessment, patient reported he has been suffering with bipolar disorder for long time and currently presented with symptoms of depression and stresses about the location back-and-forth from mons home to his fiance home. Patient has been concerned about not able to see his psychiatrist and Dr. Betti Cruz I tried psychiatric and counseling Center and  also reported his next scheduled appointment is May 2015. Patient has been positively responded to this medication adjustment and currently he is minimizing symptoms of depression and anxiety. The Depakote going down really helped a lot with my energy, I'm not bogged down or drowsy. The Effexor increase has helped my mood a lot to where I don't feel as much anxiety or depression". Patient has been contracting for safety given that he presented with suicidal ideation and wanted to give up his life. Patient will be closely monitored for the suicidal ideations, behaviors and therapeutic levels of Depakote before he will be sent to the outpatient psychiatric care.  Diagnosis:   DSM5:  Depressive Disorders:  Major Depressive Disorder - Severe (296.23) Total Time spent with patient: 20 minutes  Axis I: Major Depression, Recurrent severe Axis II: Deferred Axis III:  Past Medical History  Diagnosis Date  . Bipolar 1 disorder   . Pneumonia   . Gout    Axis IV: other psychosocial or environmental problems and problems related to social environment Axis V: 41-50 serious symptoms  ADL's:  Intact  Sleep: Fair  Appetite:  Good  Suicidal Ideation:  Denies Homicidal Ideation:  Denies AEB (as evidenced by):  Psychiatric Specialty Exam: Physical Exam  Review of Systems  Constitutional: Negative.   HENT: Negative.   Eyes: Negative.   Respiratory: Negative.   Cardiovascular: Negative.   Gastrointestinal: Negative.   Genitourinary: Negative.   Musculoskeletal: Negative.  Skin: Negative.   Neurological: Negative.   Endo/Heme/Allergies: Negative.   Psychiatric/Behavioral: Positive for depression. The patient is nervous/anxious.     Blood pressure 104/72, pulse 96, temperature 97.5 F (36.4 C), temperature source Oral, resp. rate 18, height 6\' 3"  (1.905 m), weight 87.091 kg (192 lb), SpO2 99.00%.Body mass index is 24 kg/(m^2).  General Appearance: Casual  Eye Contact::  Good  Speech:   Clear and Coherent  Volume:  Normal  Mood:  Euthymic  Affect:  Appropriate and Congruent  Thought Process:  Coherent  Orientation:  Full (Time, Place, and Person)  Thought Content:  WDL  Suicidal Thoughts:  No  Homicidal Thoughts:  No  Memory:  Immediate;   Good Recent;   Good Remote;   Good  Judgement:  Fair  Insight:  Fair  Psychomotor Activity:  Normal  Concentration:  Good  Recall:  Good  Fund of Knowledge:Good  Language: Good  Akathisia:  NA  Handed:  Right  AIMS (if indicated):     Assets:  Communication Skills Desire for Improvement Financial Resources/Insurance Housing Leisure Time Physical Health Resilience Talents/Skills  Sleep:  Number of Hours: 6.25   Musculoskeletal: Strength & Muscle Tone: within normal limits Gait & Station: normal Patient leans: N/A  Current Medications: Current Facility-Administered Medications  Medication Dose Route Frequency Provider Last Rate Last Dose  . acetaminophen (TYLENOL) tablet 650 mg  650 mg Oral Q6H PRN Beau Fanny, FNP      . alum & mag hydroxide-simeth (MAALOX/MYLANTA) 200-200-20 MG/5ML suspension 30 mL  30 mL Oral Q4H PRN Beau Fanny, FNP      . amphetamine-dextroamphetamine (ADDERALL) tablet 20 mg  20 mg Oral Daily Beau Fanny, FNP   20 mg at 04/21/13 0846  . divalproex (DEPAKOTE ER) 24 hr tablet 750 mg  750 mg Oral QHS Beau Fanny, FNP   750 mg at 04/20/13 2126  . hydrOXYzine (ATARAX/VISTARIL) tablet 25 mg  25 mg Oral Q6H PRN Beau Fanny, FNP      . influenza vac split quadrivalent PF (FLUARIX) injection 0.5 mL  0.5 mL Intramuscular Tomorrow-1000 Nehemiah Settle, MD      . magnesium hydroxide (MILK OF MAGNESIA) suspension 30 mL  30 mL Oral Daily PRN Beau Fanny, FNP      . multivitamin with minerals tablet 1 tablet  1 tablet Oral Daily Beau Fanny, FNP   1 tablet at 04/21/13 [id number]  . pneumococcal 23 valent vaccine (PNU-IMMUNE) injection 0.5 mL  0.5 mL Intramuscular Tomorrow-1000  Nehemiah Settle, MD      . traZODone (DESYREL) tablet 50 mg  50 mg Oral QHS PRN Beau Fanny, FNP      . venlafaxine (EFFEXOR) tablet 150 mg  150 mg Oral Daily Beau Fanny, FNP   150 mg at 04/21/13 0846  . venlafaxine (EFFEXOR) tablet 75 mg  75 mg Oral QAC supper Beau Fanny, FNP   75 mg at 04/20/13 1726  . vitamin C (ASCORBIC ACID) tablet 500 mg  500 mg Oral BID Beau Fanny, FNP   500 mg at 04/21/13 9604    Lab Results: No results found for this or any previous visit (from the past 48 hour(s)).  Physical Findings: AIMS: Facial and Oral Movements Muscles of Facial Expression: None, normal Lips and Perioral Area: None, normal Jaw: None, normal Tongue: None, normal,Extremity Movements Upper (arms, wrists, hands, fingers): None, normal Lower (legs, knees, ankles, toes): None, normal, Trunk Movements Neck, shoulders,  hips: None, normal, Overall Severity Severity of abnormal movements (highest score from questions above): None, normal Incapacitation due to abnormal movements: None, normal Patient's awareness of abnormal movements (rate only patient's report): No Awareness, Dental Status Current problems with teeth and/or dentures?: No Does patient usually wear dentures?: No  CIWA:  CIWA-Ar Total: 0 COWS:  COWS Total Score: 0  Treatment Plan Summary: Daily contact with patient to assess and evaluate symptoms and progress in treatment Medication management  Plan: Review of chart, vital signs, medications, and notes.  1-Individual and group therapy  2-Medication management for depression and anxiety: Medications reviewed with the patient and she stated no untoward effects. Continue current medication management and monitor for adverse effects Check/minor Depakote level for therapeutic window Case management meeting and depakote will be drawn tomorrow and disposition plan Continue Adderall 20 mg daily for ADD, Depakote ER 750 mg daily for mood swings and Effexor 150  mg in the morning and 75 mg in the afternoon for depression and continue trazodone 50 mg at bedtime as needed for insomnia. 3-Coping skills for depression, anxiety  4-Continue crisis stabilization and management  5-Address health issues--monitoring vital signs, stable  6-Treatment plan in progress to prevent relapse of depression and anxiety  Medical Decision Making Problem Points:  Established problem, stable/improving (1), Review of last therapy session (1) and Review of psycho-social stressors (1) Data Points:  Review or order clinical lab tests (1) Review or order medicine tests (1) Review of medication regiment & side effects (2) Review of new medications or change in dosage (2)  I certify that inpatient services furnished can reasonably be expected to improve the patient's condition.   Janequa Kipnis,JANARDHAHA R. 04/21/2013, 11:16 AM

## 2013-04-21 NOTE — Progress Notes (Signed)
Patient ID: Brandon Vaughn, male   DOB: 06/05/1985, 28 y.o.   MRN: [id number] D: pt. Visible on unit in dayroom interacting. Pt. Report he's here "mostly for medication change."I was never depressed with bipolar, always manic" Pt. 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 R: Pt. Is safe on the unit and attended group.

## 2013-04-21 NOTE — Progress Notes (Signed)
Patient ID: Brandon SalinesJames D Gauntt, male   DOB: 08/30/1985, 28 y.o.   MRN: [id number]  D: Pt. Denies SI/HI and A/V Hallucinations. Patient rates his depression and hopelessness at 1/10 for the day. Patient does not report any pain or discomfort at this time.   A: Support and encouragement provided to the patient. Scheduled medications given to patient per physician's orders.  R: Patient is receptive and cooperative but minimal. Patient is seen in the milieu from time to time. Q15 minute checks are maintained for safety.

## 2013-04-21 NOTE — Tx Team (Signed)
Interdisciplinary Treatment Plan Update (Adult)  Date: 04/21/2013  Time Reviewed:  9:45 AM  Progress in Treatment: Attending groups: Yes Participating in groups:  Yes Taking medication as prescribed:  Yes Tolerating medication:  Yes Family/Significant othe contact made: CSW assessing  Patient understands diagnosis:  Yes Discussing patient identified problems/goals with staff:  Yes Medical problems stabilized or resolved:  Yes Denies suicidal/homicidal ideation: Yes Issues/concerns per patient self-inventory:  Yes Other:  New problem(s) identified: N/A  Discharge Plan or Barriers: CSW assessing for appropriate referrals.  Reason for Continuation of Hospitalization: Anxiety Depression Medication Stabilization  Comments: N/A  Estimated length of stay: 2-3 days  For review of initial/current patient goals, please see plan of care.  Attendees: Patient:     Family:     Physician:  Dr. Javier GlazierJohnalagadda 04/21/2013 10:03 AM   Nursing:   Quintella ReichertBeverly Knight, RN 04/21/2013 10:03 AM   Clinical Social Worker:  Reyes Ivanhelsea Horton, LCSW 04/21/2013 10:03 AM   Other: Verne SpurrNeil Mashburn, PA 04/21/2013 10:03 AM   Other:  Sherrye PayorValerie Noch, care coordination 04/21/2013 10:03 AM   Other:  Juline PatchQuylle Hodnett, LCSW 04/21/2013 10:03 AM   Other:  Marzetta Boardhrista Dopson, RN 04/21/2013 10:03 AM   Other: Neill Loftarol Davis, RN 04/21/2013 10:03 AM   Other: Onnie BoerJennifer Clark, case manager 04/21/2013 10:05 AM   Other:    Other:    Other:    Other:     Scribe for Treatment Team:   Carmina MillerHorton, Pete Schnitzer Nicole, 04/21/2013 10:03 AM

## 2013-04-21 NOTE — [facility] Group Notes (Signed)
[facility]   04/21/2013  8:45 AM  Participation Quality:  Did Not Attend - pt sleeping in his room  Brandon IvanChelsea Horton, LCSW 04/21/2013 10:01 AM

## 2013-04-21 NOTE — Progress Notes (Signed)
Adult Psychoeducational Group Note  Date:  04/21/2013 Time:  9:11 PM  Group Topic/Focus:  Wrap-Up Group:   The focus of this group is to help patients review their daily goal of treatment and discuss progress on daily workbooks.  Participation Level:  Active  Participation Quality:  Appropriate  Affect:  Appropriate  Cognitive:  Appropriate  Insight: Appropriate  Engagement in Group:  Engaged  Modes of Intervention:  Support  Additional Comments:  Pt stated that his fav color was pink b/c that's his daughters fav and he missed her. Pt stated he learned that he can control his anger a lot better than he thought he could   Bita Cartwright 04/21/2013, 9:11 PM

## 2013-04-22 DIAGNOSIS — F332 Major depressive disorder, recurrent severe without psychotic features: Principal | ICD-10-CM

## 2013-04-22 LAB — VALPROIC ACID LEVEL: Valproic Acid Lvl: 54.7 ug/mL (ref 50.0–100.0)

## 2013-04-22 NOTE — Progress Notes (Signed)
D:  Patient demanding to leave, stating he would not be staying here another night.  He requested to speak with someone higher than me.  He also spent a great deal of time speaking with Renata Capriceonrad the NP.   Patient states that he feels good and he is not having any self harm thoughts.  He refused to sign a  72 hour request for discharge.  A:  Long discussion with patient about ensuring that we had therapeutic levels on his medications, ensuring that he was safe and that he was not having any thoughts of self harm.  I notified Minerva Areolaric who came to the unit and reinforced what the patient had already been told.  We then both spoke with the NP who agreed to call Dr. Shela CommonsJ at home.  Patient was then given an AMA form which he read and signed.  Patient was escorted off the unit and to the lobby where his father was waiting.   R:  Patient verbalized understanding of the process.  States he has a follow up appointment and knows what his medications are.  Agreed to sign the form and left the building with his belongings.

## 2013-04-22 NOTE — Discharge Summary (Signed)
Physician Discharge Summary Note  Patient:  Brandon Vaughn is an 28 y.o., male MRN:  [id number] DOB:  Jun 16, 1985 Patient phone:  [phone] (home)  Patient address:   [address] [place] [address],  Total Time spent with patient: 20 minutes (follow-up appointment, done before [facility] discharge)  Date of Admission:  04/18/2013 Date of Discharge: 04/22/2013  Reason for Admission:  MDD with instability and medication stabilization  Discharge Diagnoses: Active Problems:   MDD (major depressive disorder), severe   Psychiatric Specialty Exam: Physical Exam  Review of Systems  Constitutional: Negative.   HENT: Negative.   Eyes: Negative.   Respiratory: Negative.   Cardiovascular: Negative.   Gastrointestinal: Negative.   Genitourinary: Negative.   Musculoskeletal: Negative.   Skin: Negative.   Neurological: Negative.   Endo/Heme/Allergies: Negative.   Psychiatric/Behavioral: Positive for depression. The patient is nervous/anxious.     Blood pressure 121/81, pulse 86, temperature 97.4 F (36.3 C), temperature source Oral, resp. rate 16, height 6\' 3"  (1.905 m), weight 87.091 kg (192 lb), SpO2 99.00%.Body mass index is 24 kg/(m^2).  General Appearance: Casual  Eye Contact::  Good  Speech:  Clear and Coherent  Volume:  Normal  Mood:  Calm, but intermittently anxious about discharge concerns  Affect:  Appropriate  Thought Process:  Coherent and Goal Directed  Orientation:  Full (Time, Place, and Person)  Thought Content:  WDL  Suicidal Thoughts:  No  Homicidal Thoughts:  No  Memory:  Immediate;   Good Recent;   Good Remote;   Good  Judgement:  Good  Insight:  Good  Psychomotor Activity:  Normal  Concentration:  Good  Recall:  Good  Fund of Knowledge:Good  Language: Good  Akathisia:  NA  Handed:    AIMS (if indicated):     Assets:  Communication Skills Desire for Improvement Resilience Social Support  Sleep:  Number of Hours: 5.5     Musculoskeletal: Strength & Muscle Tone: within normal limits Gait & Station: normal Patient leans: N/A  DSM5: Depressive Disorders:  Major Depressive Disorder - Severe (296.23)  Axis Diagnosis:   AXIS I:  Major Depression, Recurrent severe AXIS II:  Deferred AXIS III:   Past Medical History  Diagnosis Date  . Bipolar 1 disorder   . Pneumonia   . Gout    AXIS IV:  other psychosocial or environmental problems and problems related to social environment AXIS V:  61-70 mild symptoms  Level of Care:  OP  Hospital Course:  Brandon Vaughn is an 28 y.o. male presented with his mother, reports increased depression over the past 3-4 months. Pt reports SI, stating, "I have never been this depressed. I just want to give up." Patient has a hx of a diagnosis of Bipolar Disorder. He had his first psychotic break at age 62. He has been hospitalized twice at [facility] in the past for this. Pt has been on medications that have kept him stable for 10 years. He takes Depakote, Adderall, and Effexor. Pt receives outpatient treatment at [facility] in [place]. Pt reports several neurovegetative sx of depression, including not grooming or bathing. Pt admits to being unable to function. He has been missing work and had to leave work due to a crying spell earlier this week. Current stressors are conflict between his mother and his girlfriend that do not agree on the care of his three year old daughter. Pt recently moved out of his mother's home after living at home all of his life and  moved in with his girlfriend and her parents for 3 months. It did not work out for the pt and he moved back home with his mother. Pt has a hx of manic episodes with AVH and delusions. However, it has been years since pt experienced psychosis and currently denies. He denies SA or HI. Pt is cooperative, and knows he needs help to stabilize his sx. He has good insight into his mental illness and takes his medications as prescribed. Pt  endorses sx of anxiety. During his stay at [facility], patient reported he has been suffering with bipolar disorder for long time and currently presented with symptoms of depression and stresses about the location back-and-forth from mons home to his fiance home. Patient has been concerned about not able to see his psychiatrist and Dr. Betti Cruz at [facility] and also reported his next scheduled appointment is May 2015. Patient has positively responded to this medication adjustment and currently he is minimizing symptoms of depression and anxiety. "The Depakote going down really helped a lot with my energy, I'm not bogged down or drowsy. The Effexor increase has helped my mood a lot to where I don't feel as much anxiety or depression".   During Hospitalization: Medications managed, psychoeducation, group and individual therapy. Pt currently denies SI, HI, and Psychosis. At discharge, pt rates anxiety and depression at a minimum, stating that he feels he is ready to discharge. During this follow-up appointment, which later was deemed a discharge assessment, pt asked why he could not leave the hospital today. Pt was informed that he was not on the discharge list which is composed by our treatment team. Pt expressed concern that "many people have come in with suicidal ideation or even a suicide attempt with cuts on them and they have left sooner than me. I don't understand why I am still here. I've made great progress, I've been fine for days, my medications are perfectly stable. I feel great, no depression, no anxiety, no suicidal or homicidal thoughts... There is no medical reason to keep me here". Encouraged pt to calm down for a moment and allow this NP to check with Dr. Elsie Saas to determine possibility of discharge today. Informed pt that he was not on the scheduled list, but that this NP would contact the above MD to determine if a discharge was possible, given the fact that the patient  did have reasonable points about his progress. Pt agreed to remain calm while this NP investigated his case further. Pt was visibly upset, but did calm down during the conversation, stating "I feel a lot better after talking to you but please let me know what is going on". Pt was seen walking into the dayroom to sit down and watch television at that moment while this NP went to review the patient's records and prepare to call Dr. Elsie Saas. Multiple phone calls were had with above MD about this case. During this communication, pt was discharged via [facility] paperwork before a proper discharge could be performed by providers.   *Pt left AMA before a proper discharge could be performed.   Consults:  None  Significant Diagnostic Studies:  None  Discharge Vitals:   Blood pressure 121/81, pulse 86, temperature 97.4 F (36.3 C), temperature source Oral, resp. rate 16, height 6\' 3"  (1.905 m), weight 87.091 kg (192 lb), SpO2 99.00%. Body mass index is 24 kg/(m^2). Lab Results:   Results for orders placed during the hospital encounter of 04/18/13 (from the past 72  hour(s))  VALPROIC ACID LEVEL     Status: None   Collection Time    04/22/13  6:10 AM      Result Value Ref Range   Valproic Acid Lvl 54.7  50.0 - 100.0 ug/mL   Comment: [facility]    Physical Findings: AIMS: Facial and Oral Movements Muscles of Facial Expression: None, normal Lips and Perioral Area: None, normal Jaw: None, normal Tongue: None, normal,Extremity Movements Upper (arms, wrists, hands, fingers): None, normal Lower (legs, knees, ankles, toes): None, normal, Trunk Movements Neck, shoulders, hips: None, normal, Overall Severity Severity of abnormal movements (highest score from questions above): None, normal Incapacitation due to abnormal movements: None, normal Patient's awareness of abnormal movements (rate only patient's report): No Awareness, Dental Status Current problems with teeth and/or  dentures?: No Does patient usually wear dentures?: No  CIWA:  CIWA-Ar Total: 0 COWS:  COWS Total Score: 0  Psychiatric Specialty Exam: See Psychiatric Specialty Exam and Suicide Risk Assessment completed by Attending Physician prior to discharge.  Discharge destination:  Home  Is patient on multiple antipsychotic therapies at discharge:  No   Has Patient had three or more failed trials of antipsychotic monotherapy by history:  No  Recommended Plan for Multiple Antipsychotic Therapies: NA     Medication List    ASK your doctor about these medications     Indication   amphetamine-dextroamphetamine 20 MG tablet  Commonly known as:  ADDERALL  Take 20 mg by mouth 3 (three) times daily.      divalproex 500 MG 24 hr tablet  Commonly known as:  DEPAKOTE ER  Take 1,000 mg by mouth at bedtime.      multivitamin Tabs tablet  Take 1 tablet by mouth daily.      venlafaxine XR 75 MG 24 hr capsule  Commonly known as:  EFFEXOR-XR  Take 75 mg by mouth 2 (two) times daily.      vitamin C 500 MG tablet  Commonly known as:  ASCORBIC ACID  Take 500 mg by mouth 2 (two) times daily.            Follow-up Information   Follow up with [facility] On 05/01/2013. (Appointment scheduled at 1:20 pm on this date with Dr. Betti Cruzeddy for medication management)    Contact information:   [address] [place], [place] [address] Phone: [phone] Fax: [phone]      Follow-up recommendations:  Activity:  As tolerated. Diet:  Heart healthy with low sodium.  Comments:   Take all medications as prescribed. Keep all follow-up appointments as scheduled.  Do not consume alcohol or use illegal drugs while on prescription medications. Report any adverse effects from your medications to your primary care provider promptly.  In the event of recurrent symptoms or worsening symptoms, call 911, a crisis hotline, or go to the nearest emergency department for evaluation.   Total Discharge  Time:  Greater than 30 minutes.  Signed: Beau FannyWithrow, John C, FNP-BC 04/22/2013, 5:43 PM  Patient was seen face-to-face psychiatric evaluation and examination, suicide risk assessment, case discussed with the treatment team and physician extender and made appropriate disposition plan. Reviewed the information documented and agree with the treatment plan.  Othon Guardia,JANARDHAHA R. 04/28/2013 6:40 PM

## 2013-04-22 NOTE — [facility] Group Notes (Signed)
[facility] LCSW Group Therapy      Feelings About Diagnosis 1:15 - 2:30 PM         04/22/2013  3:18 PM    Type of Therapy:  Group Therapy  Participation Level: Minimal  Participation Quality:  Appropriate  Affect:  Appropriate  Cognitive:  Alert and Appropriate  Insight:  Developing/Improving   Engagement in Therapy:  Developing/Improving   Modes of Intervention:  Discussion, Education, Exploration, Problem-Solving, Rapport Building, Support  Summary of Progress/Problems:  Patient actively participated in group. Patient discussed past and present diagnosis and the effects it has had on  life.  Patient talked about family and society being judgmental and the stigma associated with having a mental health diagnosis.  He shared the average person does not understand what it means to be depressed or have a mental health diagnosis.    Wynn BankerHodnett, Yacine Garriga Hairston 04/22/2013  3:18 PM

## 2013-04-22 NOTE — [facility] Group Notes (Signed)
[facility]  04/22/2013 1:15 PM   Type of Therapy:  Group Therapy  Participation Level:  Did Not Attend  Brandon IvanChelsea Horton, LCSW 04/22/2013 2:37 PM

## 2013-04-22 NOTE — Progress Notes (Signed)
Recreation Therapy Notes   Animal-Assisted Activity/Therapy (AAA/T) Program Checklist/Progress Notes Patient Eligibility Criteria Checklist & Daily Group note for Rec Tx Intervention  Date: 03.30.2015 Time: 2:45pm Location: [address] Day[address]    AAA/T Program Assumption of Risk Form signed by Patient/ or Parent Legal Guardian yes  Patient is free of allergies or sever asthma yes  Patient reports no fear of animals yes  Patient reports no history of cruelty to animals yes   Patient understands his/her participation is voluntary yes  Patient washes hands before animal contact yes  Patient washes hands after animal contact yes  Behavioral Response: Agitated   Education: Hand Washing, Appropriate Animal Interaction   Education Outcome: Acknowledges understanding   Clinical Observations/Feedback: Upon entering group session LRT asked patient name, patient refused to provide his information to LRT. LRT encouraged patient to state his name, however patient left group session at this point. Patient returned to group session approximately 20 minutes later, this time he was willing to state his name for LRT. Patient had minimal interaction with therapy dog and made no statements or contributions during his time in group.   Marykay Lexenise L Merrilee Ancona, LRT/CTRS  Lamonica Trueba L 04/22/2013 5:11 PM

## 2013-04-22 NOTE — Clinical Social Work Note (Signed)
Patient stated as group was ending that he would be leaving the hospital tomorrow if he had to jump over the fence.  He stated he feels he is being held here against his will. CSW encouraged patient to remain calm and hopefully he will be discharged soon but acting out would only resulting in being here longer.

## 2013-04-22 NOTE — Progress Notes (Signed)
D:  Per pt self inventory pt reports sleeping poor, appetite good, energy level normal, ability to pay attention good, rates depression at a 1 out of 10 and hopelessness at a 1 out of 10, denies SI/HI/AVH, calm, no complaints at this time other than he wants to be discharged.     A:  Emotional support provided, Encouraged pt to continue with treatment plan and attend all group activities, q15 min checks maintained for safety.  R:  Pt is going to some groups but is becoming increasingly upset about the fact that he has to stay here another day, pt states that he does not feel like he needs to be here.

## 2013-04-22 NOTE — [facility] Group Notes (Signed)
Adult Psychoeducational Group Note  Date:  04/22/2013 Time: 0900am  Group Topic/Focus:  Goals Group:   The focus of this group is to help patients establish daily goals to achieve during treatment and discuss how the patient can incorporate goal setting into their daily lives to aide in recovery. Orientation:   The focus of this group is to educate the patient on the purpose and policies of crisis stabilization and provide a format to answer questions about their admission.  The group details unit policies and expectations of patients while admitted.  Participation Level:  Did Not Attend  Alfonse Sprucehorne, Kaydince Towles Brooke 04/22/2013, 11:14 AM

## 2013-04-23 NOTE — Progress Notes (Signed)
(  Discharged yesterday AMA) [facility] :  Will you be returning to the same living situation after discharge: Yes,  returning home At discharge, do you have transportation home?:Yes,  father picked pt up Do you have the ability to pay for your medications:Yes,  provided pt with prescriptions and pt verbalizes ability to afford meds  Release of information consent forms completed and in the chart;  Patient's signature needed at discharge.  Patient to Follow up at: Follow-up Information   Follow up with [facility] On 05/01/2013. (Appointment scheduled at 1:20 pm on this date with Dr. Betti Cruzeddy for medication management)    Contact information:   [address] [place], [place] [address] Phone: [phone] Fax: [phone]      Patient denies SI/HI:   Yes,  denies SI/HI    Safety Planning and Suicide Prevention discussed:  Yes,  discussed with pt and pt's mother.  See suicide prevention education note.   Carmina MillerHorton, Easton Sivertson Nicole 04/23/2013, 8:03 AM

## 2013-04-24 NOTE — Progress Notes (Signed)
Patient Discharge Instructions:  After Visit Summary (AVS):   Faxed to:  04/24/13 Psychiatric Admission Assessment Note:   Faxed to:  04/24/13 Faxed/Sent to the Next Level Care provider:  04/24/13 Faxed to [facility] @ [phone]  Jerelene ReddenSheena E [place], 04/24/2013, 3:29 PM

## 2021-04-08 ENCOUNTER — Encounter (HOSPITAL_COMMUNITY): Payer: Self-pay | Admitting: Internal Medicine

## 2021-04-08 ENCOUNTER — Other Ambulatory Visit: Payer: Self-pay

## 2021-04-08 ENCOUNTER — Emergency Department (HOSPITAL_BASED_OUTPATIENT_CLINIC_OR_DEPARTMENT_OTHER): Payer: No Typology Code available for payment source

## 2021-04-08 ENCOUNTER — Observation Stay (HOSPITAL_COMMUNITY): Payer: No Typology Code available for payment source

## 2021-04-08 ENCOUNTER — Observation Stay (HOSPITAL_BASED_OUTPATIENT_CLINIC_OR_DEPARTMENT_OTHER)
Admission: EM | Admit: 2021-04-08 | Discharge: 2021-04-11 | Disposition: A | Discharge status: Home / Self Care | Payer: No Typology Code available for payment source | Attending: Internal Medicine | Admitting: Internal Medicine

## 2021-04-08 DIAGNOSIS — K8020 Calculus of gallbladder without cholecystitis without obstruction: Secondary | ICD-10-CM

## 2021-04-08 DIAGNOSIS — Z79899 Other long term (current) drug therapy: Secondary | ICD-10-CM | POA: Insufficient documentation

## 2021-04-08 DIAGNOSIS — K8000 Calculus of gallbladder with acute cholecystitis without obstruction: Secondary | ICD-10-CM | POA: Diagnosis not present

## 2021-04-08 DIAGNOSIS — F322 Major depressive disorder, single episode, severe without psychotic features: Secondary | ICD-10-CM | POA: Diagnosis present

## 2021-04-08 DIAGNOSIS — R1013 Epigastric pain: Secondary | ICD-10-CM

## 2021-04-08 DIAGNOSIS — R339 Retention of urine, unspecified: Secondary | ICD-10-CM | POA: Insufficient documentation

## 2021-04-08 DIAGNOSIS — Z20822 Contact with and (suspected) exposure to covid-19: Secondary | ICD-10-CM | POA: Diagnosis not present

## 2021-04-08 DIAGNOSIS — E871 Hypo-osmolality and hyponatremia: Secondary | ICD-10-CM

## 2021-04-08 DIAGNOSIS — R935 Abnormal findings on diagnostic imaging of other abdominal regions, including retroperitoneum: Secondary | ICD-10-CM

## 2021-04-08 DIAGNOSIS — F17210 Nicotine dependence, cigarettes, uncomplicated: Secondary | ICD-10-CM | POA: Diagnosis not present

## 2021-04-08 DIAGNOSIS — R338 Other retention of urine: Secondary | ICD-10-CM

## 2021-04-08 DIAGNOSIS — R109 Unspecified abdominal pain: Secondary | ICD-10-CM

## 2021-04-08 DIAGNOSIS — R7989 Other specified abnormal findings of blood chemistry: Secondary | ICD-10-CM

## 2021-04-08 DIAGNOSIS — K82A1 Gangrene of gallbladder in cholecystitis: Secondary | ICD-10-CM | POA: Diagnosis not present

## 2021-04-08 DIAGNOSIS — F319 Bipolar disorder, unspecified: Secondary | ICD-10-CM | POA: Insufficient documentation

## 2021-04-08 DIAGNOSIS — R1011 Right upper quadrant pain: Secondary | ICD-10-CM | POA: Diagnosis present

## 2021-04-08 LAB — COMPREHENSIVE METABOLIC PANEL
ALT: 52 U/L — ABNORMAL HIGH (ref 0–44)
AST: 24 U/L (ref 15–41)
Albumin: 4.7 g/dL (ref 3.5–5.0)
Alkaline Phosphatase: 52 U/L (ref 38–126)
Anion gap: 9 (ref 5–15)
BUN: 15 mg/dL (ref 6–20)
CO2: 27 mmol/L (ref 22–32)
Calcium: 10.4 mg/dL — ABNORMAL HIGH (ref 8.9–10.3)
Chloride: 98 mmol/L (ref 98–111)
Creatinine, Ser: 0.92 mg/dL (ref 0.61–1.24)
GFR, Estimated: >60 mL/min (ref >60)
Glucose, Bld: 129 mg/dL — ABNORMAL HIGH (ref 70–99)
Potassium: 4 mmol/L (ref 3.5–5.1)
Sodium: 134 mmol/L — ABNORMAL LOW (ref 135–145)
Total Bilirubin: 0.4 mg/dL (ref 0.3–1.2)
Total Protein: 7.4 g/dL (ref 6.5–8.1)

## 2021-04-08 LAB — GLUCOSE, CAPILLARY: Glucose-Capillary: 112 mg/dL — ABNORMAL HIGH (ref 70–99)

## 2021-04-08 LAB — CBC
HCT: 43.7 % (ref 39.0–52.0)
Hemoglobin: 14.7 g/dL (ref 13.0–17.0)
MCH: 27.9 pg (ref 26.0–34.0)
MCHC: 33.6 g/dL (ref 30.0–36.0)
MCV: 82.9 fL (ref 80.0–100.0)
Platelets: 227 10*3/uL (ref 150–400)
RBC: 5.27 MIL/uL (ref 4.22–5.81)
RDW: 12.5 % (ref 11.5–15.5)
WBC: 11.6 10*3/uL — ABNORMAL HIGH (ref 4.0–10.5)
nRBC: 0 % (ref 0.0–0.2)

## 2021-04-08 LAB — RESP PANEL BY RT-PCR (FLU A&B, COVID) ARPGX2
Influenza A by PCR: NEGATIVE
Influenza B by PCR: NEGATIVE
SARS Coronavirus 2 by RT PCR: NEGATIVE

## 2021-04-08 LAB — URINALYSIS, ROUTINE W REFLEX MICROSCOPIC
Bilirubin Urine: NEGATIVE
Glucose, UA: NEGATIVE mg/dL
Hgb urine dipstick: NEGATIVE
Ketones, ur: NEGATIVE mg/dL
Leukocytes,Ua: NEGATIVE
Nitrite: NEGATIVE
Protein, ur: NEGATIVE mg/dL
Specific Gravity, Urine: 1.019 (ref 1.005–1.030)
pH: 7.5 (ref 5.0–8.0)

## 2021-04-08 LAB — LIPASE, BLOOD: Lipase: 43 U/L (ref 11–51)

## 2021-04-08 MED ORDER — HYDROMORPHONE HCL 1 MG/ML IJ SOLN
1.0000 mg | Freq: Once | INTRAMUSCULAR | Status: AC
  Administered 2021-04-08: 1 mg via INTRAVENOUS
  Filled 2021-04-08: qty 1

## 2021-04-08 MED ORDER — VENLAFAXINE HCL 50 MG PO TABS
100.0000 mg | ORAL_TABLET | Freq: Every day | ORAL | Status: DC
  Administered 2021-04-09 – 2021-04-11 (×3): 100 mg via ORAL
  Filled 2021-04-08 (×3): qty 2

## 2021-04-08 MED ORDER — DIVALPROEX SODIUM ER 500 MG PO TB24
1000.0000 mg | ORAL_TABLET | Freq: Every day | ORAL | Status: DC
Start: 2021-04-08 — End: 2021-04-09
  Administered 2021-04-08: 1000 mg via ORAL
  Filled 2021-04-08: qty 2

## 2021-04-08 MED ORDER — MORPHINE SULFATE (PF) 4 MG/ML IV SOLN
4.0000 mg | Freq: Once | INTRAVENOUS | Status: AC
  Administered 2021-04-08: 4 mg via INTRAVENOUS
  Filled 2021-04-08: qty 1

## 2021-04-08 MED ORDER — GADOBUTROL 1 MMOL/ML IV SOLN
10.0000 mL | Freq: Once | INTRAVENOUS | Status: AC | PRN
  Administered 2021-04-08: 10 mL via INTRAVENOUS

## 2021-04-08 MED ORDER — AMPHETAMINE-DEXTROAMPHETAMINE 10 MG PO TABS
20.0000 mg | ORAL_TABLET | Freq: Every day | ORAL | Status: DC
  Administered 2021-04-10: 20 mg via ORAL
  Filled 2021-04-08: qty 1

## 2021-04-08 MED ORDER — VENLAFAXINE HCL 75 MG PO TABS
200.0000 mg | ORAL_TABLET | Freq: Every day | ORAL | Status: DC
  Administered 2021-04-08 – 2021-04-10 (×3): 200 mg via ORAL
  Filled 2021-04-08 (×2): qty 4
  Filled 2021-04-08: qty 1

## 2021-04-08 MED ORDER — ONDANSETRON HCL 4 MG/2ML IJ SOLN
4.0000 mg | Freq: Once | INTRAMUSCULAR | Status: AC
  Administered 2021-04-08: 4 mg via INTRAVENOUS
  Filled 2021-04-08: qty 2

## 2021-04-08 MED ORDER — LAMOTRIGINE 25 MG PO TABS
150.0000 mg | ORAL_TABLET | Freq: Every morning | ORAL | Status: DC
  Administered 2021-04-09 – 2021-04-11 (×3): 150 mg via ORAL
  Filled 2021-04-08 (×2): qty 2

## 2021-04-08 MED ORDER — LACTATED RINGERS IV SOLN
INTRAVENOUS | Status: AC
Start: 2021-04-08 — End: 2021-04-09

## 2021-04-08 MED ORDER — ASCORBIC ACID 500 MG PO TABS
500.0000 mg | ORAL_TABLET | Freq: Two times a day (BID) | ORAL | Status: DC
  Administered 2021-04-08 – 2021-04-11 (×6): 500 mg via ORAL
  Filled 2021-04-08 (×5): qty 1

## 2021-04-08 MED ORDER — IOHEXOL 300 MG/ML  SOLN
100.0000 mL | Freq: Once | INTRAMUSCULAR | Status: AC | PRN
  Administered 2021-04-08: 100 mL via INTRAVENOUS

## 2021-04-08 MED ORDER — ADULT MULTIVITAMIN W/MINERALS CH
1.0000 | ORAL_TABLET | Freq: Every day | ORAL | Status: DC
  Administered 2021-04-09 – 2021-04-11 (×3): 1 via ORAL
  Filled 2021-04-08 (×3): qty 1

## 2021-04-08 MED ORDER — AMPHETAMINE-DEXTROAMPHETAMINE 10 MG PO TABS
40.0000 mg | ORAL_TABLET | Freq: Every day | ORAL | Status: DC
Start: 2021-04-09 — End: 2021-04-11
  Administered 2021-04-11: 40 mg via ORAL
  Filled 2021-04-08: qty 2
  Filled 2021-04-08 (×3): qty 4

## 2021-04-08 MED ORDER — HYDROMORPHONE HCL 1 MG/ML IJ SOLN
1.0000 mg | INTRAMUSCULAR | Status: DC | PRN
  Administered 2021-04-08 – 2021-04-09 (×3): 1 mg via INTRAVENOUS
  Filled 2021-04-08 (×3): qty 1

## 2021-04-08 MED ORDER — VITAMIN B-12 1000 MCG PO TABS
1000.0000 ug | ORAL_TABLET | Freq: Every day | ORAL | Status: DC
  Administered 2021-04-09 – 2021-04-11 (×3): 1000 ug via ORAL
  Filled 2021-04-08 (×2): qty 1

## 2021-04-08 NOTE — Consult Note (Addendum)
? ? ? ? ?Date of service:  04/09/21 ? ?CC: RUQ pain, ?Gallbladder mass? ? ?Referreing:  Dr. Albertine GratesFang Xu ? ?HPI: ?Brandon Vaughn is an 36 y.o. male with hx bipolar d/o presented to [facility] with RUQ pain that began suddenly at 2am 3/17. Dull/achy, nonradiating. Antacids did not help. No emesis; +nausea, chills.   This morning he reports that his pain is moving from the epigastrium to the right upper quadrant.  He has had no previous severe attacks like this but reports he may have had some bloating from time to time.  Again, he currently describes his pain as sharp and severe as well as continuous ? ?Past Medical History:  ?Diagnosis Date  ? Bipolar 1 disorder (HCC)   ? Gout   ? Pneumonia   ? ? ?Past Surgical History:  ?Procedure Laterality Date  ? APPENDECTOMY    ? ARTHROSCOPIC REPAIR ACL    ? KNEE SURGERY    ? lymph node removal    ? TONSILLECTOMY    ? TYMPANOPLASTY    ? ? ?Family History  ?Problem Relation Age of Onset  ? Heart disease Sister   ? Coronary artery disease Other   ? Bipolar disorder Other   ? ? ?Social:  reports that he has been smoking cigarettes. He has a 7.00 pack-year smoking history. He has never used smokeless tobacco. He reports that he does not drink alcohol and does not use drugs. ? ?Allergies:  ?Allergies  ?Allergen Reactions  ? Bee Venom Anaphylaxis  ? Codeine   ? Hydrocodone   ? ? ?Medications: I have reviewed the patient's current medications. ? ?Results for orders placed or performed during the hospital encounter of 04/08/21 (from the past 48 hour(s))  ?Urinalysis, Routine w reflex microscopic Urine, Clean Catch     Status: None  ? Collection Time: 04/08/21  2:12 PM  ?Result Value Ref Range  ? Color, Urine YELLOW YELLOW  ? APPearance CLEAR CLEAR  ? Specific Gravity, Urine 1.019 1.005 - 1.030  ? pH 7.5 5.0 - 8.0  ? Glucose, UA NEGATIVE NEGATIVE mg/dL  ? Hgb urine dipstick NEGATIVE NEGATIVE  ? Bilirubin Urine NEGATIVE NEGATIVE  ? Ketones, ur NEGATIVE NEGATIVE mg/dL  ? Protein, ur NEGATIVE NEGATIVE  mg/dL  ? Nitrite NEGATIVE NEGATIVE  ? Leukocytes,Ua NEGATIVE NEGATIVE  ?  Comment: Performed at [facility], [address], [place], [place] [address]  ?Lipase, blood     Status: None  ? Collection Time: 04/08/21  2:37 PM  ?Result Value Ref Range  ? Lipase 43 11 - 51 U/L  ?  Comment: Performed at [facility], [address], [place], [place] [address]  ?Comprehensive metabolic panel     Status: Abnormal  ? Collection Time: 04/08/21  2:37 PM  ?Result Value Ref Range  ? Sodium 134 (L) 135 - 145 mmol/L  ? Potassium 4.0 3.5 - 5.1 mmol/L  ? Chloride 98 98 - 111 mmol/L  ? CO2 27 22 - 32 mmol/L  ? Glucose, Bld 129 (H) 70 - 99 mg/dL  ?  Comment: Glucose reference range applies only to samples taken after fasting for at least 8 hours.  ? BUN 15 6 - 20 mg/dL  ? Creatinine, Ser 0.92 0.61 - 1.24 mg/dL  ? Calcium 10.4 (H) 8.9 - 10.3 mg/dL  ? Total Protein 7.4 6.5 - 8.1 g/dL  ? Albumin 4.7 3.5 - 5.0 g/dL  ? AST 24 15 - 41 U/L  ? ALT 52 (H)  0 - 44 U/L  ? Alkaline Phosphatase 52 38 - 126 U/L  ? Total Bilirubin 0.4 0.3 - 1.2 mg/dL  ? GFR, Estimated >60 >60 mL/min  ?  Comment: (NOTE) ?Calculated using the CKD-EPI Creatinine Equation (2021) ?  ? Anion gap 9 5 - 15  ?  Comment: Performed at [facility], [address], [place], [place] [address]  ?CBC     Status: Abnormal  ? Collection Time: 04/08/21  2:37 PM  ?Result Value Ref Range  ? WBC 11.6 (H) 4.0 - 10.5 K/uL  ? RBC 5.27 4.22 - 5.81 MIL/uL  ? Hemoglobin 14.7 13.0 - 17.0 g/dL  ? HCT 43.7 39.0 - 52.0 %  ? MCV 82.9 80.0 - 100.0 fL  ? MCH 27.9 26.0 - 34.0 pg  ? MCHC 33.6 30.0 - 36.0 g/dL  ? RDW 12.5 11.5 - 15.5 %  ? Platelets 227 150 - 400 K/uL  ? nRBC 0.0 0.0 - 0.2 %  ?  Comment: Performed at [facility], [address] [place], [place] [address]  ? ? ?CT ABDOMEN PELVIS W CONTRAST ? ?Result Date: 04/08/2021 ?CLINICAL DATA:  Biliary obstruction suspected (Ped 0-17y) mass in upper abdomen Patient  reports epigastric pain for 1 day.  Nausea. EXAM: CT ABDOMEN AND PELVIS WITH CONTRAST TECHNIQUE: Multidetector CT imaging of the abdomen and pelvis was performed using the standard protocol following bolus administration of intravenous contrast. RADIATION DOSE REDUCTION: This exam was performed according to the departmental dose-optimization program which includes automated exposure control, adjustment of the mA and/or kV according to patient size and/or use of iterative reconstruction technique. CONTRAST:  OMNIPAQUE IOHEXOL 300 MG/ML  SOLN COMPARISON:  Right upper quadrant ultrasound earlier today. FINDINGS: Lower chest: Ventilatory changes in the dependent lungs. No pleural fluid. Hepatobiliary: Diffusely decreased hepatic density typical of steatosis. There is no discrete liver lesion. Slightly hyperdense gallbladder contents. No gallbladder calcifications. Questionable gallbladder wall thickening, although this was not seen on recent ultrasound. No definite pericholecystic inflammation. Normal common bile duct. Pancreas: No ductal dilatation or inflammation.  No pancreatic mass. Spleen: Normal in size without focal abnormality. Adrenals/Urinary Tract: Normal adrenal glands. No hydronephrosis or perinephric edema. Homogeneous renal enhancement. There are tiny hypodense structures in the posterior mid right and lower left kidney that are likely a cyst but too small to characterize. Urinary bladder is physiologically distended without wall thickening. Stomach/Bowel: Ingested material within the stomach. No gastric wall thickening. Normal small bowel without obstruction or inflammation. Appendectomy. Moderate colonic stool burden without colonic inflammation Vascular/Lymphatic: Normal caliber abdominal aorta. Patent portal, splenic, and mesenteric veins. No enlarged lymph nodes in the abdomen or pelvis. Reproductive: Prostate is unremarkable.  Presumed vasectomy clips. Other: No free air or ascites.  No  abdominal wall hernia. Musculoskeletal: There are no acute or suspicious osseous abnormalities. L4-L5 and L5-S1 disc bulge. IMPRESSION: 1. Slightly hyperdense gallbladder contents with questionable gallbladder wall thickening, although this was not seen on recent ultrasound. No definite pericholecystic inflammation. The vascular gallbladder contents on ultrasound are not further delineated on the current exam. Recommend MRI/MRCP without and with IV contrast for further assessment. 2. No biliary obstruction. 3. Hepatic steatosis. 4. Moderate colonic stool burden, can be seen with constipation. Electronically Signed   By: Narda Rutherford M.D.   On: 04/08/2021 17:24  ? ?US Abdomen Limited RUQ (LIVER/GB) ? ?Result Date: 04/08/2021 ?CLINICAL DATA:  Right upper quadrant pain EXAM: ULTRASOUND ABDOMEN LIMITED RIGHT UPPER QUADRANT COMPARISON:  None. FINDINGS: Gallbladder: There  is echogenic nonshadowing vascular masslike structure in the gallbladder fossa. No gallbladder wall thickening. Common bile duct: Diameter: 7 mm Liver: No focal lesion identified. Echogenic hepatic parenchyma limiting evaluation. Portal vein is patent on color Doppler imaging with normal direction of blood flow towards the liver. Other: None. IMPRESSION: 1. Echogenic nonshadowing masslike structure in the gallbladder fossa with prominent vascular flow. This may represent a vascular malformation or a neoplasm. Further evaluation with cross-sectional imaging is recommended. 2.  No biliary ductal dilatation. 3.  Marked hepatic steatosis. Above findings were reported to Dr. Laveda Norman at approximately 4:40 p.m. on 04/08/2021. Electronically Signed   By: Larose Hires D.O.   On: 04/08/2021 16:46   ? ?ROS - all of the below systems have been reviewed with the patient and positives are indicated with bold text ?General: chills, fever or night sweats ?Eyes: blurry vision or double vision ?ENT: epistaxis or sore throat ?Allergy/Immunology: itchy/watery eyes or nasal  congestion ?Hematologic/Lymphatic: bleeding problems, blood clots or swollen lymph nodes ?Endocrine: temperature intolerance or unexpected weight changes ?Breast: new or changing breast lumps or nipple discharg

## 2021-04-08 NOTE — ED Triage Notes (Signed)
Pt to ED from urgent care  c/o abdominal pain that started early this morning. Reports centralized location radiating to back. Reports nausea ?

## 2021-04-08 NOTE — ED Provider Notes (Signed)
?[facility] EMERGENCY DEPT ?Provider Note ? ? ?CSN: [id number] ?Arrival date & time: 04/08/21  1401 ? ?  ? ?History ? ?Chief Complaint  ?Patient presents with  ? Abdominal Pain  ? ? ?Brandon Vaughn is a 36 y.o. male. ? ?The history is provided by the patient, the spouse and medical records. No language interpreter was used.  ?Abdominal Pain ? ?  ? ?Brandon Vaughn is a 36 year-old-male with a history of appendectomy and recent vasectomy who presents [facility] the ED from [facility] for complaints of abdominal pain. Pain started around 2 AM this morning at his mid-upper abdomen and was initially dull and achy. The pain worsened into a sharp 10/10 pain across his upper abdomen that radiated to his back. Patient initially tried antacids which did not alleviate symptoms. Pain is worse with movement and touch. He endorses chills and nausea but denies fever, chest pain, shortness of breath, cough, vomiting, diarrhea or constipation. Patient denies any precipitating trauma or this pain happening before. He is a former smoker who quit in 2018 and denies any alcohol consumption or illicit drug use. Denies recent illness, travels, or concerns for STDs. Patient denies any recent fatty meals but states he does not eat "very healthy" in general. He denies any history of gallstones. Patient recently missed his PCP appointment and does not have updated blood work. ? ?Home Medications ?Prior to Admission medications   ?Medication Sig Start Date End Date Taking? Authorizing Provider  ?amphetamine-dextroamphetamine (ADDERALL) 20 MG tablet Take 20 mg by mouth 3 (three) times daily.     [provider]  ?divalproex (DEPAKOTE ER) 500 MG 24 hr tablet Take 1,000 mg by mouth at bedtime.     [provider]  ?multivitamin (ONE-A-DAY MEN'S) TABS Take 1 tablet by mouth daily.      [provider]  ?venlafaxine XR (EFFEXOR-XR) 75 MG 24 hr capsule Take 75 mg by mouth 2 (two) times daily.    [provider]  ?vitamin C  (ASCORBIC ACID) 500 MG tablet Take 500 mg by mouth 2 (two) times daily.      [provider]  ?   ? ?Allergies    ?Bee venom, Codeine, and Hydrocodone   ? ?Review of Systems   ?Review of Systems  ?Gastrointestinal:  Positive for abdominal pain.  ?All other systems reviewed and are negative. ? ?Physical Exam ?Updated Vital Signs ?BP (!) 158/96   Pulse 74   Temp (!) 97.4 ?F (36.3 ?C) (Oral)   Resp 20   Ht 6\' 3"  (1.905 m)   Wt 108.9 kg   SpO2 100%   BMI 30.00 kg/m?  ?Physical Exam ?Vitals and nursing note reviewed.  ?Constitutional:   ?   General: He is not in acute distress. ?   Appearance: He is well-developed.  ?   Comments: Patient is laying in bed appears uncomfortable  ?HENT:  ?   Head: Atraumatic.  ?Eyes:  ?   Conjunctiva/sclera: Conjunctivae normal.  ?Cardiovascular:  ?   Rate and Rhythm: Normal rate and regular rhythm.  ?   Heart sounds: Normal heart sounds.  ?Pulmonary:  ?   Effort: Pulmonary effort is normal.  ?   Breath sounds: Normal breath sounds. No wheezing, rhonchi or rales.  ?Abdominal:  ?   General: Abdomen is flat.  ?   Tenderness: There is abdominal tenderness in the right upper quadrant and epigastric area. There is no guarding or rebound. Negative signs include Murphy's sign, Rovsing's sign and  McBurney's sign.  ?Musculoskeletal:  ?   Cervical back: Neck supple.  ?Skin: ?   Findings: No rash.  ?Neurological:  ?   Mental Status: He is alert.  ? ? ?ED Results / Procedures / Treatments   ?Labs ?(all labs ordered are listed, but only abnormal results are displayed) ?Labs Reviewed  ?COMPREHENSIVE METABOLIC PANEL - Abnormal; Notable for the following components:  ?    Result Value  ? Sodium 134 (*)   ? Glucose, Bld 129 (*)   ? Calcium 10.4 (*)   ? ALT 52 (*)   ? All other components within normal limits  ?CBC - Abnormal; Notable for the following components:  ? WBC 11.6 (*)   ? All other components within normal limits  ?RESP PANEL BY RT-PCR (FLU A&B, COVID) ARPGX2  ?LIPASE, BLOOD   ?URINALYSIS, ROUTINE W REFLEX MICROSCOPIC  ? ? ?EKG ?None ? ?Radiology ?CT ABDOMEN PELVIS W CONTRAST ? ?Result Date: 04/08/2021 ?CLINICAL DATA:  Biliary obstruction suspected (Ped 0-17y) mass in upper abdomen Patient reports epigastric pain for 1 day.  Nausea. EXAM: CT ABDOMEN AND PELVIS WITH CONTRAST TECHNIQUE: Multidetector CT imaging of the abdomen and pelvis was performed using the standard protocol following bolus administration of intravenous contrast. RADIATION DOSE REDUCTION: This exam was performed according to the departmental dose-optimization program which includes automated exposure control, adjustment of the mA and/or kV according to patient size and/or use of iterative reconstruction technique. CONTRAST:  100mL OMNIPAQUE IOHEXOL 300 MG/ML  SOLN COMPARISON:  Right upper quadrant ultrasound earlier today. FINDINGS: Lower chest: Ventilatory changes in the dependent lungs. No pleural fluid. Hepatobiliary: Diffusely decreased hepatic density typical of steatosis. There is no discrete liver lesion. Slightly hyperdense gallbladder contents. No gallbladder calcifications. Questionable gallbladder wall thickening, although this was not seen on recent ultrasound. No definite pericholecystic inflammation. Normal common bile duct. Pancreas: No ductal dilatation or inflammation.  No pancreatic mass. Spleen: Normal in size without focal abnormality. Adrenals/Urinary Tract: Normal adrenal glands. No hydronephrosis or perinephric edema. Homogeneous renal enhancement. There are tiny hypodense structures in the posterior mid right and lower left kidney that are likely a cyst but too small to characterize. Urinary bladder is physiologically distended without wall thickening. Stomach/Bowel: Ingested material within the stomach. No gastric wall thickening. Normal small bowel without obstruction or inflammation. Appendectomy. Moderate colonic stool burden without colonic inflammation Vascular/Lymphatic: Normal caliber  abdominal aorta. Patent portal, splenic, and mesenteric veins. No enlarged lymph nodes in the abdomen or pelvis. Reproductive: Prostate is unremarkable.  Presumed vasectomy clips. Other: No free air or ascites.  No abdominal wall hernia. Musculoskeletal: There are no acute or suspicious osseous abnormalities. L4-L5 and L5-S1 disc bulge. IMPRESSION: 1. Slightly hyperdense gallbladder contents with questionable gallbladder wall thickening, although this was not seen on recent ultrasound. No definite pericholecystic inflammation. The vascular gallbladder contents on ultrasound are not further delineated on the current exam. Recommend MRI/MRCP without and with IV contrast for further assessment. 2. No biliary obstruction. 3. Hepatic steatosis. 4. Moderate colonic stool burden, can be seen with constipation. Electronically Signed   By: Narda RutherfordMelanie  Sanford M.D.   On: 04/08/2021 17:24  ? ?US Abdomen Limited RUQ (LIVER/GB) ? ?Result Date: 04/08/2021 ?CLINICAL DATA:  Right upper quadrant pain EXAM: ULTRASOUND ABDOMEN LIMITED RIGHT UPPER QUADRANT COMPARISON:  None. FINDINGS: Gallbladder: There is echogenic nonshadowing vascular masslike structure in the gallbladder fossa. No gallbladder wall thickening. Common bile duct: Diameter: 7 mm Liver: No focal lesion identified. Echogenic hepatic parenchyma limiting evaluation. Portal vein  is patent on color Doppler imaging with normal direction of blood flow towards the liver. Other: None. IMPRESSION: 1. Echogenic nonshadowing masslike structure in the gallbladder fossa with prominent vascular flow. This may represent a vascular malformation or a neoplasm. Further evaluation with cross-sectional imaging is recommended. 2.  No biliary ductal dilatation. 3.  Marked hepatic steatosis. Above findings were reported to Dr. Laveda Norman at approximately 4:40 p.m. on 04/08/2021. Electronically Signed   By: Larose Hires D.O.   On: 04/08/2021 16:46   ? ?Procedures ?Marland KitchenCritical Care ?Performed by: Fayrene Helper, PA-C ?Authorized by: Fayrene Helper, PA-C  ? ?Critical care provider statement:  ?  Critical care time (minutes):  35 ?  Critical care was time spent personally by me on the following activities:  Development of tre

## 2021-04-08 NOTE — Progress Notes (Signed)
Plan of Care Note for accepted transfer ? ? ?Patient: Brandon Vaughn MRN: [id number]   DOA: 04/08/2021 ? ?Facility requesting transfer: [facility] ED ?Requesting Provider: Fayrene Helper PA ?Reason for transfer: Abdominal pain ?Facility course:  ?Patient is a 36 year old male with a past medical history of bipolar disorder presented with complaints of right upper quadrant abdominal pain.  Vital signs stable, not febrile.  WBC 11.6. ? ?Right upper quadrant ultrasound showing: ?"IMPRESSION: ?1. Echogenic nonshadowing masslike structure in the gallbladder ?fossa with prominent vascular flow. This may represent a vascular ?malformation or a neoplasm. Further evaluation with cross-sectional ?imaging is recommended. ?  ?2.  No biliary ductal dilatation. ?  ?3.  Marked hepatic steatosis" ? ?CT abdomen pelvis with contrast showing: ?"IMPRESSION: ?1. Slightly hyperdense gallbladder contents with questionable ?gallbladder wall thickening, although this was not seen on recent ?ultrasound. No definite pericholecystic inflammation. The vascular ?gallbladder contents on ultrasound are not further delineated on the ?current exam. Recommend MRI/MRCP without and with IV contrast for ?further assessment. ?2. No biliary obstruction. ?3. Hepatic steatosis. ?4. Moderate colonic stool burden, can be seen with constipation." ? ?ED PA spoke to GI, recommended consulting general surgery.  He spoke to general surgery (Dr. Cliffton Asters), will consult. ? ?Plan of care: ?The patient is accepted for admission to Med-surg  unit, at [facility]..  ? ?Author: ?John Giovanni, MD ?04/08/2021 ? ?Check [email or web address] for on-call coverage. ? ?Nursing staff, Please call [facility] Admits & Consults System-Wide number on Amion as soon as patient's arrival, so appropriate admitting provider can evaluate the pt. ?

## 2021-04-08 NOTE — H&P (Signed)
?History and Physical  ? ? ?Brandon Vaughn ZOX:[id number] DOB: 06/17/1985 DOA: 04/08/2021 ? ?PCP: Eartha InchBadger, Michael C, MD  ?Patient coming from: Home. ? ?Chief Complaint: Abdominal pain. ? ?HPI: Brandon Vaughn is a 36 y.o. male with history of bipolar disorder presented to the ER at med center with complaints of abdominal pain.  Patient's pain started this morning around 1 AM.  Pain is mostly in the epigastric area radiating to the right upper quadrant with nausea.  Pain was severe at times rating it as 10 out of 10.  Denies any fever chills or diarrhea. ? ?ED Course: In the ER patient's labs show mildly elevated ALT of 52.  Calcium was 10.4 total bilirubin and lipase were normal.  Patient underwent ultrasound of the abdomen which showed possible vascular neoplasm in the gallbladder.  This was followed by CT scan of the abdomen pelvis which showed some hyperdense material within the gallbladder with possible gallbladder wall thickening.  Radiologist recommended MRCP.  General surgeon Dr. Cliffton AstersWhite was consulted.  Patient admitted for further work-up.  On my exam patient does have epigastric tenderness.  COVID test was negative.  WBC count was 1.6. ? ?Review of Systems: As per HPI, rest all negative. ? ? ?Past Medical History:  ?Diagnosis Date  ? Bipolar 1 disorder (HCC)   ? Gout   ? Pneumonia   ? ? ?Past Surgical History:  ?Procedure Laterality Date  ? APPENDECTOMY    ? ARTHROSCOPIC REPAIR ACL    ? KNEE SURGERY    ? lymph node removal    ? TONSILLECTOMY    ? TYMPANOPLASTY    ? ? ? reports that he has been smoking cigarettes. He has a 7.00 pack-year smoking history. He has never used smokeless tobacco. He reports that he does not drink alcohol and does not use drugs. ? ?Allergies  ?Allergen Reactions  ? Bee Venom Anaphylaxis  ? Codeine   ? Hydrocodone   ? ? ?Family History  ?Problem Relation Age of Onset  ? Heart disease Sister   ? Coronary artery disease Other   ? Bipolar disorder Other   ? ? ?Prior to Admission medications    ?Medication Sig Start Date End Date Taking? Authorizing Provider  ?amphetamine-dextroamphetamine (ADDERALL) 20 MG tablet Take 20-40 mg by mouth 2 (two) times daily. Take 2 tablets (40 mg) in the morning and Take 1 tablet (20 mg) in afternoon   Yes [provider]  ?divalproex (DEPAKOTE ER) 500 MG 24 hr tablet Take 1,000 mg by mouth at bedtime.    Yes [provider]  ?lamoTRIgine (LAMICTAL) 150 MG tablet Take 150 mg by mouth every morning. 03/08/21  Yes [provider]  ?Misc Natural Products (GINKOGIN PO) Take 1 capsule by mouth daily.   Yes [provider]  ?multivitamin (ONE-A-DAY MEN'S) TABS Take 1 tablet by mouth daily.     Yes [provider]  ?venlafaxine (EFFEXOR) 100 MG tablet Take 100-200 mg by mouth 2 (two) times daily. Take 1 tablet (100 mg) in the morning & Take 2 tablets (200 mg) at bedtime 03/13/21  Yes [provider]  ?vitamin B-12 (CYANOCOBALAMIN) 1000 MCG tablet Take 1,000 mcg by mouth daily.   Yes [provider]  ?vitamin C (ASCORBIC ACID) 500 MG tablet Take 500 mg by mouth 2 (two) times daily.     Yes [provider]  ? ? ?Physical Exam: ?Constitutional: Moderately built and nourished. ?Vitals:  ? 04/08/21 1900 04/08/21 1930 04/08/21 2015 04/08/21  2121  ?BP: (!) 142/93 125/84 126/83 (!) 141/89  ?Pulse: 83 77 73 79  ?Resp: 16   18  ?Temp:    97.9 ?F (36.6 ?C)  ?TempSrc:    Oral  ?SpO2: 99% 99% 100% 99%  ?Weight:      ?Height:      ? ?Eyes: Anicteric no pallor. ?ENMT: No discharge from the ears eyes nose and mouth. ?Neck: No mass felt.  No neck rigidity. ?Respiratory: No rhonchi or crepitations. ?Cardiovascular: S1-S2 heard. ?Abdomen: Epigastric tenderness no guarding or rigidity. ?Musculoskeletal: No edema. ?Skin: No rash. ?Neurologic: Alert awake oriented time place and person.  Moves all extremities. ?Psychiatric: Appears normal.  Normal affect. ? ? ?Labs on Admission: I have personally reviewed following labs and imaging  studies ? ?CBC: ?Recent Labs  ?Lab 04/08/21 ?1437  ?WBC 11.6*  ?HGB 14.7  ?HCT 43.7  ?MCV 82.9  ?PLT 227  ? ?Basic Metabolic Panel: ?Recent Labs  ?Lab 04/08/21 ?1437  ?NA 134*  ?K 4.0  ?CL 98  ?CO2 27  ?GLUCOSE 129*  ?BUN 15  ?CREATININE 0.92  ?CALCIUM 10.4*  ? ?GFR: ?Estimated Creatinine Clearance: 146.6 mL/min (by C-G formula based on SCr of 0.92 mg/dL). ?Liver Function Tests: ?Recent Labs  ?Lab 04/08/21 ?1437  ?AST 24  ?ALT 52*  ?ALKPHOS 52  ?BILITOT 0.4  ?PROT 7.4  ?ALBUMIN 4.7  ? ?Recent Labs  ?Lab 04/08/21 ?1437  ?LIPASE 43  ? ?No results for input(s): AMMONIA in the last 168 hours. ?Coagulation Profile: ?No results for input(s): INR, PROTIME in the last 168 hours. ?Cardiac Enzymes: ?No results for input(s): CKTOTAL, CKMB, CKMBINDEX, TROPONINI in the last 168 hours. ?BNP (last 3 results) ?No results for input(s): PROBNP in the last 8760 hours. ?HbA1C: ?No results for input(s): HGBA1C in the last 72 hours. ?CBG: ?No results for input(s): GLUCAP in the last 168 hours. ?Lipid Profile: ?No results for input(s): CHOL, HDL, LDLCALC, TRIG, CHOLHDL, LDLDIRECT in the last 72 hours. ?Thyroid Function Tests: ?No results for input(s): TSH, T4TOTAL, FREET4, T3FREE, THYROIDAB in the last 72 hours. ?Anemia Panel: ?No results for input(s): VITAMINB12, FOLATE, FERRITIN, TIBC, IRON, RETICCTPCT in the last 72 hours. ?Urine analysis: ?   ?Component Value Date/Time  ? COLORURINE YELLOW 04/08/2021 1412  ? APPEARANCEUR CLEAR 04/08/2021 1412  ? LABSPEC 1.019 04/08/2021 1412  ? PHURINE 7.5 04/08/2021 1412  ? GLUCOSEU NEGATIVE 04/08/2021 1412  ? HGBUR NEGATIVE 04/08/2021 1412  ? BILIRUBINUR NEGATIVE 04/08/2021 1412  ? KETONESUR NEGATIVE 04/08/2021 1412  ? PROTEINUR NEGATIVE 04/08/2021 1412  ? UROBILINOGEN 1.0 04/18/2013 2138  ? NITRITE NEGATIVE 04/08/2021 1412  ? LEUKOCYTESUR NEGATIVE 04/08/2021 1412  ? ?Sepsis Labs: ?@LABRCNTIP (procalcitonin:4,lacticidven:4) ?) ?Recent Results (from the past 240 hour(s))  ?Resp Panel by RT-PCR (Flu  A&B, Covid) Nasopharyngeal Swab     Status: None  ? Collection Time: 04/08/21  6:13 PM  ? Specimen: Nasopharyngeal Swab; Nasopharyngeal(NP) swabs in vial transport medium  ?Result Value Ref Range Status  ? SARS Coronavirus 2 by RT PCR NEGATIVE NEGATIVE Final  ?  Comment: (NOTE) ?SARS-CoV-2 target nucleic acids are NOT DETECTED. ? ?The SARS-CoV-2 RNA is generally detectable in upper respiratory ?specimens during the acute phase of infection. The lowest ?concentration of SARS-CoV-2 viral copies this assay can detect is ?138 copies/mL. A negative result does not preclude SARS-Cov-2 ?infection and should not be used as the sole basis for treatment or ?other patient management decisions. A negative result may occur with  ?improper specimen collection/handling, submission of specimen other ?than  nasopharyngeal swab, presence of viral mutation(s) within the ?areas targeted by this assay, and inadequate number of viral ?copies(<138 copies/mL). A negative result must be combined with ?clinical observations, patient history, and epidemiological ?information. The expected result is Negative. ? ?Fact Sheet for Patients:  ?BloggerCourse.com ? ?Fact Sheet for Healthcare Providers:  ?SeriousBroker.it ? ?This test is no t yet approved or cleared by the [place] FDA and  ?has been authorized for detection and/or diagnosis of SARS-CoV-2 by ?FDA under an Emergency Use Authorization (EUA). This EUA will remain  ?in effect (meaning this test can be used) for the duration of the ?COVID-19 declaration under Section 564(b)(1) of the Act, 21 ?U.S.C.section 360bbb-3(b)(1), unless the authorization is terminated  ?or revoked sooner.  ? ? ?  ? Influenza A by PCR NEGATIVE NEGATIVE Final  ? Influenza B by PCR NEGATIVE NEGATIVE Final  ?  Comment: (NOTE) ?The Xpert Xpress SARS-CoV-2/FLU/RSV plus assay is intended as an aid ?in the diagnosis of influenza from Nasopharyngeal swab specimens  and ?should not be used as a sole basis for treatment. Nasal washings and ?aspirates are unacceptable for Xpert Xpress SARS-CoV-2/FLU/RSV ?testing. ? ?Fact Sheet for Patients: ?SkincareIndustry.si

## 2021-04-08 NOTE — ED Notes (Signed)
Pt reports upper abd pain, onset this morning 0200. Nausea present, last BM this AM. States pain radiates to right back. Sharp in nature.  ?

## 2021-04-09 DIAGNOSIS — R7989 Other specified abnormal findings of blood chemistry: Secondary | ICD-10-CM | POA: Diagnosis not present

## 2021-04-09 DIAGNOSIS — R109 Unspecified abdominal pain: Secondary | ICD-10-CM

## 2021-04-09 DIAGNOSIS — K8020 Calculus of gallbladder without cholecystitis without obstruction: Secondary | ICD-10-CM

## 2021-04-09 LAB — CBC WITH DIFFERENTIAL/PLATELET
Abs Immature Granulocytes: 0.05 10*3/uL (ref 0.00–0.07)
Basophils Absolute: 0 10*3/uL (ref 0.0–0.1)
Basophils Relative: 0 %
Eosinophils Absolute: 0.1 10*3/uL (ref 0.0–0.5)
Eosinophils Relative: 1 %
HCT: 44.4 % (ref 39.0–52.0)
Hemoglobin: 14.9 g/dL (ref 13.0–17.0)
Immature Granulocytes: 1 %
Lymphocytes Relative: 19 %
Lymphs Abs: 1.9 10*3/uL (ref 0.7–4.0)
MCH: 28.8 pg (ref 26.0–34.0)
MCHC: 33.6 g/dL (ref 30.0–36.0)
MCV: 85.9 fL (ref 80.0–100.0)
Monocytes Absolute: 1 10*3/uL (ref 0.1–1.0)
Monocytes Relative: 10 %
Neutro Abs: 7.1 10*3/uL (ref 1.7–7.7)
Neutrophils Relative %: 69 %
Platelets: 250 10*3/uL (ref 150–400)
RBC: 5.17 MIL/uL (ref 4.22–5.81)
RDW: 13 % (ref 11.5–15.5)
WBC: 10.3 10*3/uL (ref 4.0–10.5)
nRBC: 0 % (ref 0.0–0.2)

## 2021-04-09 LAB — HEPATITIS PANEL, ACUTE
HCV Ab: NONREACTIVE
Hep A IgM: NONREACTIVE
Hep B C IgM: NONREACTIVE
Hepatitis B Surface Ag: NONREACTIVE

## 2021-04-09 LAB — GLUCOSE, CAPILLARY
Glucose-Capillary: 97 mg/dL (ref 70–99)
Glucose-Capillary: 108 mg/dL — ABNORMAL HIGH (ref 70–99)
Glucose-Capillary: 109 mg/dL — ABNORMAL HIGH (ref 70–99)

## 2021-04-09 LAB — HEPATIC FUNCTION PANEL
ALT: 266 U/L — ABNORMAL HIGH (ref 0–44)
AST: 177 U/L — ABNORMAL HIGH (ref 15–41)
Albumin: 4.2 g/dL (ref 3.5–5.0)
Alkaline Phosphatase: 65 U/L (ref 38–126)
Bilirubin, Direct: 0.2 mg/dL (ref 0.0–0.2)
Indirect Bilirubin: 0.5 mg/dL (ref 0.3–0.9)
Total Bilirubin: 0.7 mg/dL (ref 0.3–1.2)
Total Protein: 7 g/dL (ref 6.5–8.1)

## 2021-04-09 LAB — BASIC METABOLIC PANEL
Anion gap: 9 (ref 5–15)
BUN: 12 mg/dL (ref 6–20)
CO2: 29 mmol/L (ref 22–32)
Calcium: 9.6 mg/dL (ref 8.9–10.3)
Chloride: 96 mmol/L — ABNORMAL LOW (ref 98–111)
Creatinine, Ser: 0.85 mg/dL (ref 0.61–1.24)
GFR, Estimated: >60 mL/min (ref >60)
Glucose, Bld: 108 mg/dL — ABNORMAL HIGH (ref 70–99)
Potassium: 4 mmol/L (ref 3.5–5.1)
Sodium: 134 mmol/L — ABNORMAL LOW (ref 135–145)

## 2021-04-09 LAB — LIPASE, BLOOD: Lipase: 35 U/L (ref 11–51)

## 2021-04-09 LAB — TROPONIN I (HIGH SENSITIVITY): Troponin I (High Sensitivity): 4 ng/L (ref <18)

## 2021-04-09 LAB — HIV ANTIBODY (ROUTINE TESTING W REFLEX): HIV Screen 4th Generation wRfx: NONREACTIVE

## 2021-04-09 MED ORDER — LACTATED RINGERS IV SOLN: INTRAVENOUS | Status: AC

## 2021-04-09 MED ORDER — KETOROLAC TROMETHAMINE 30 MG/ML IJ SOLN
30.0000 mg | Freq: Three times a day (TID) | INTRAMUSCULAR | Status: AC
  Administered 2021-04-09 (×3): 30 mg via INTRAVENOUS
  Filled 2021-04-09 (×3): qty 1

## 2021-04-09 MED ORDER — METRONIDAZOLE 500 MG/100ML IV SOLN
500.0000 mg | Freq: Two times a day (BID) | INTRAVENOUS | Status: DC
  Administered 2021-04-09 – 2021-04-11 (×5): 500 mg via INTRAVENOUS
  Filled 2021-04-09 (×5): qty 100

## 2021-04-09 MED ORDER — HYDROMORPHONE HCL 1 MG/ML IJ SOLN
1.0000 mg | INTRAMUSCULAR | Status: DC | PRN
  Administered 2021-04-09 – 2021-04-11 (×5): 1 mg via INTRAVENOUS
  Filled 2021-04-09 (×5): qty 1

## 2021-04-09 MED ORDER — HYDROMORPHONE HCL 1 MG/ML IJ SOLN
1.0000 mg | Freq: Once | INTRAMUSCULAR | Status: AC
  Administered 2021-04-09: 1 mg via INTRAVENOUS
  Filled 2021-04-09: qty 1

## 2021-04-09 MED ORDER — OXYCODONE HCL 5 MG PO TABS
5.0000 mg | ORAL_TABLET | ORAL | Status: DC | PRN
  Administered 2021-04-10: 5 mg via ORAL
  Filled 2021-04-09: qty 1

## 2021-04-09 MED ORDER — SODIUM CHLORIDE 0.9 % IV SOLN
2.0000 g | INTRAVENOUS | Status: DC
  Administered 2021-04-09 – 2021-04-11 (×3): 2 g via INTRAVENOUS
  Filled 2021-04-09 (×3): qty 20

## 2021-04-09 NOTE — Progress Notes (Signed)
Bladder scan this am = 177 and 265 mls. Asked pt to stand and void and pt stated that he was unable. Inserted # 16 Fr foley w return of 600 cc's of amber urine, attached to st drain bag. Tol procedure well. ?

## 2021-04-09 NOTE — Progress Notes (Signed)
I let patient and his wife know that his Depakote was discontinued by MD Erlinda Hong because his LFTs is elevated, they stated they were unaware. ?

## 2021-04-09 NOTE — Progress Notes (Signed)
?PROGRESS NOTE ? ? ? ?Brandon Vaughn  PTW:[id number] DOB: [birth year] DOA: 04/08/2021 ?PCP: Eartha Inch, MD  ? ? ? ?Brief Narrative:  ? ?Brandon Vaughn is a 36 y.o. male with history of bipolar disorder presented to the ER at med center with complaints of abdominal pain.   ?Found to have symptomatic cholelithiasis ?General surgery consulted, OR tomorrow on 3/19 ? ?Subjective: ? ?In pain, wife at bedside ? ?Assessment & Plan: ? Principal Problem: ?  Abdominal pain ?Active Problems: ?  MDD (major depressive disorder), severe (HCC) ? ? ?Assessment and Plan: ? ? ?Abdominal pain with elevation of LFT ?-Positive gallstones+/- gallbladder wall edema ?-Lipase unremarkable ?-General surgery consulted, OR tomorrow for lap chole if hepatitis panel negative, continue antibiotics ?-IV hydration, prn analgesic ? ?Acute urinary retention ?>600cc drained ,  Foley placed on 3/18 ?Need voiding trial prior to discharge ? ? ?Mild hypercalcemia, on presentation ?Calcium normalized after hydration ? ? ?History of bipolar ?Continue home medication except Depakote due to LFT elevation ?Resume after LFT improvement  ? ? Body mass index is 30 kg/m?Marland Kitchen. ? ? ?I have Reviewed nursing notes, Vitals, pain scores, I/o's, Lab results and  imaging results since pt's last encounter, details please see discussion above  ?I ordered the following labs:  ?Unresulted Labs (From admission, onward)  ? ?  Start     Ordered  ? 04/10/21 0500  Comprehensive metabolic panel  Tomorrow morning,   R       ? 04/09/21 0832  ? 04/09/21 0832  Hepatitis panel, acute  Once,   R       ? 04/09/21 7001  ? ?  ?  ? ?  ? ? ? ?DVT prophylaxis: SCDs Start: 04/08/21 2224 ? ? ?Code Status:   Code Status: Full Code ? ?Family Communication: Wife at bedside ?Disposition:  ? ? ?Dispo: The patient is from: Home ?             Anticipated d/c is to: Home ?             Anticipated d/c date is: 24 to 48 hours, need general surgery clearance ? ?Antimicrobials:   ? ?Anti-infectives (From  admission, onward)  ? ? Start     Dose/Rate Route Frequency Ordered Stop  ? 04/09/21 0400  cefTRIAXone (ROCEPHIN) 2 g in sodium chloride 0.9 % 100 mL IVPB       ? 2 g ?200 mL/hr over 30 Minutes Intravenous Every 24 hours 04/09/21 0331    ? 04/09/21 0400  metroNIDAZOLE (FLAGYL) IVPB 500 mg       ? 500 mg ?100 mL/hr over 60 Minutes Intravenous Every 12 hours 04/09/21 0331    ? ?  ? ? ? ? ? ?Objective: ?Vitals:  ? 04/09/21 0049 04/09/21 0546 04/09/21 0959 04/09/21 1331  ?BP: 140/83 120/73 126/75 108/76  ?Pulse: 80 94 99 100  ?Resp: 18 18 16 15   ?Temp: 98.3 ?F (36.8 ?C) 98.4 ?F (36.9 ?C) 98 ?F (36.7 ?C) 98.9 ?F (37.2 ?C)  ?TempSrc: Oral Oral  Oral  ?SpO2: 97% 97% 99% 96%  ?Weight:      ?Height:      ? ? ?Intake/Output Summary (Last 24 hours) at 04/09/2021 1548 ?Last data filed at 04/09/2021 1400 ?Gross per 24 hour  ?Intake 436.16 ml  ?Output 400 ml  ?Net 36.16 ml  ? ?Filed Weights  ? 04/08/21 1405  ?Weight: 108.9 kg  ? ? ?Examination: ? ?General exam: In pain ?Respiratory system: Clear  to auscultation. Respiratory effort normal. ?Cardiovascular system:  RRR.  ?Gastrointestinal system: Right upper quadrant/epigastric tenderness.  Normal bowel sounds heard. ?Central nervous system: Alert and oriented. No focal neurological deficits. ?Extremities:  no edema ?Skin: No rashes, lesions or ulcers ?Psychiatry: Anxious ? ? ? ?Data Reviewed: I have personally reviewed  labs and visualized  imaging studies since the last encounter and formulate the plan  ? ? ? ? ? ? ?Scheduled Meds: ? amphetamine-dextroamphetamine  20 mg Oral Daily  ? amphetamine-dextroamphetamine  40 mg Oral Daily  ? vitamin C  500 mg Oral BID  ? ketorolac  30 mg Intravenous Q8H  ? lamoTRIgine  150 mg Oral q morning  ? multivitamin with minerals  1 tablet Oral Daily  ? venlafaxine  100 mg Oral Daily  ? venlafaxine  200 mg Oral QHS  ? vitamin B-12  1,000 mcg Oral Daily  ? ?Continuous Infusions: ? cefTRIAXone (ROCEPHIN)  IV 2 g (04/09/21 7262)  ? lactated ringers 125  mL/hr at 04/08/21 2358  ? lactated ringers 75 mL/hr at 04/09/21 0757  ? metronidazole 500 mg (04/09/21 0529)  ? ? ? LOS: 0 days  ? ? ? ?Albertine Grates, MD PhD FACP ?[facility] ? ?Available via Epic secure chat 7am-7pm for nonurgent issues ?Please page for urgent issues ?To page the attending provider between 7A-7P or the covering provider during after hours 7P-7A, please log into the web site [email or web address] and access using universal  password for that web site. If you do not have the password, please call the hospital operator. ? ? ? ?04/09/2021, 3:48 PM  ? ? ?

## 2021-04-09 NOTE — Progress Notes (Signed)
rm 1321, Brandon Vaughn 35yo. In with Abd pain, No urine output for 8 hours, Bladder scan now shows 751 ml. Can we I & O cath now. ?

## 2021-04-09 NOTE — Progress Notes (Signed)
NP Blount was secured paged because patient want his Depakote 1,000mg  ordered for tonight. ?

## 2021-04-10 ENCOUNTER — Observation Stay (HOSPITAL_COMMUNITY): Payer: No Typology Code available for payment source

## 2021-04-10 ENCOUNTER — Observation Stay (HOSPITAL_BASED_OUTPATIENT_CLINIC_OR_DEPARTMENT_OTHER): Payer: No Typology Code available for payment source | Admitting: Anesthesiology

## 2021-04-10 ENCOUNTER — Other Ambulatory Visit: Payer: Self-pay

## 2021-04-10 ENCOUNTER — Encounter (HOSPITAL_COMMUNITY)
Admission: EM | Disposition: A | Discharge status: Home / Self Care | Payer: Self-pay | Source: Home / Self Care | Attending: Emergency Medicine

## 2021-04-10 ENCOUNTER — Encounter (HOSPITAL_COMMUNITY): Payer: Self-pay | Admitting: Internal Medicine

## 2021-04-10 ENCOUNTER — Observation Stay (HOSPITAL_COMMUNITY): Payer: No Typology Code available for payment source | Admitting: Anesthesiology

## 2021-04-10 DIAGNOSIS — R7989 Other specified abnormal findings of blood chemistry: Secondary | ICD-10-CM | POA: Diagnosis not present

## 2021-04-10 DIAGNOSIS — K8000 Calculus of gallbladder with acute cholecystitis without obstruction: Secondary | ICD-10-CM | POA: Diagnosis not present

## 2021-04-10 DIAGNOSIS — E871 Hypo-osmolality and hyponatremia: Secondary | ICD-10-CM | POA: Diagnosis not present

## 2021-04-10 DIAGNOSIS — K8020 Calculus of gallbladder without cholecystitis without obstruction: Secondary | ICD-10-CM

## 2021-04-10 HISTORY — PX: CHOLECYSTECTOMY: SHX55

## 2021-04-10 LAB — COMPREHENSIVE METABOLIC PANEL
ALT: 285 U/L — ABNORMAL HIGH (ref 0–44)
AST: 139 U/L — ABNORMAL HIGH (ref 15–41)
Albumin: 3.6 g/dL (ref 3.5–5.0)
Alkaline Phosphatase: 101 U/L (ref 38–126)
Anion gap: 9 (ref 5–15)
BUN: 12 mg/dL (ref 6–20)
CO2: 28 mmol/L (ref 22–32)
Calcium: 8.8 mg/dL — ABNORMAL LOW (ref 8.9–10.3)
Chloride: 94 mmol/L — ABNORMAL LOW (ref 98–111)
Creatinine, Ser: 0.88 mg/dL (ref 0.61–1.24)
GFR, Estimated: >60 mL/min (ref >60)
Glucose, Bld: 92 mg/dL (ref 70–99)
Potassium: 3.6 mmol/L (ref 3.5–5.1)
Sodium: 131 mmol/L — ABNORMAL LOW (ref 135–145)
Total Bilirubin: 2.3 mg/dL — ABNORMAL HIGH (ref 0.3–1.2)
Total Protein: 6 g/dL — ABNORMAL LOW (ref 6.5–8.1)

## 2021-04-10 LAB — GLUCOSE, CAPILLARY
Glucose-Capillary: 144 mg/dL — ABNORMAL HIGH (ref 70–99)
Glucose-Capillary: 182 mg/dL — ABNORMAL HIGH (ref 70–99)

## 2021-04-10 SURGERY — LAPAROSCOPIC CHOLECYSTECTOMY WITH INTRAOPERATIVE CHOLANGIOGRAM
Anesthesia: General | Site: Abdomen

## 2021-04-10 MED ORDER — BUPIVACAINE-EPINEPHRINE (PF) 0.25% -1:200000 IJ SOLN
INTRAMUSCULAR | Status: AC
  Filled 2021-04-10: qty 30

## 2021-04-10 MED ORDER — SODIUM CHLORIDE 0.9 % IV BOLUS
500.0000 mL | Freq: Once | INTRAVENOUS | Status: AC
Start: 2021-04-10 — End: 2021-04-11
  Administered 2021-04-10: 500 mL via INTRAVENOUS

## 2021-04-10 MED ORDER — KETOROLAC TROMETHAMINE 30 MG/ML IJ SOLN
INTRAMUSCULAR | Status: AC
  Filled 2021-04-10: qty 1

## 2021-04-10 MED ORDER — FENTANYL CITRATE PF 50 MCG/ML IJ SOSY
PREFILLED_SYRINGE | INTRAMUSCULAR | Status: AC
  Filled 2021-04-10: qty 3

## 2021-04-10 MED ORDER — ONDANSETRON HCL 4 MG/2ML IJ SOLN
INTRAMUSCULAR | Status: DC | PRN
  Administered 2021-04-10: 4 mg via INTRAVENOUS

## 2021-04-10 MED ORDER — 0.9 % SODIUM CHLORIDE (POUR BTL) OPTIME
TOPICAL | Status: DC | PRN
  Administered 2021-04-10: 1000 mL

## 2021-04-10 MED ORDER — OXYCODONE HCL 5 MG/5ML PO SOLN: 5.0000 mg | Freq: Once | ORAL | Status: DC | PRN

## 2021-04-10 MED ORDER — ACETAMINOPHEN 10 MG/ML IV SOLN
1000.0000 mg | Freq: Once | INTRAVENOUS | Status: DC | PRN
  Administered 2021-04-10: 1000 mg via INTRAVENOUS

## 2021-04-10 MED ORDER — PROPOFOL 10 MG/ML IV BOLUS
INTRAVENOUS | Status: AC
  Filled 2021-04-10: qty 20

## 2021-04-10 MED ORDER — BUPIVACAINE HCL (PF) 0.5 % IJ SOLN
INTRAMUSCULAR | Status: DC | PRN
Start: 2021-04-10 — End: 2021-04-10
  Administered 2021-04-10: 20 mL

## 2021-04-10 MED ORDER — IOHEXOL 300 MG/ML  SOLN
INTRAMUSCULAR | Status: DC | PRN
  Administered 2021-04-10: 10 mL

## 2021-04-10 MED ORDER — OXYCODONE HCL 5 MG PO TABS: 5.0000 mg | ORAL_TABLET | Freq: Once | ORAL | Status: DC | PRN

## 2021-04-10 MED ORDER — LACTATED RINGERS IR SOLN
Status: DC | PRN
  Administered 2021-04-10: 1000 mL

## 2021-04-10 MED ORDER — KETOROLAC TROMETHAMINE 30 MG/ML IJ SOLN
30.0000 mg | Freq: Three times a day (TID) | INTRAMUSCULAR | Status: AC
  Administered 2021-04-10 (×2): 30 mg via INTRAVENOUS
  Filled 2021-04-10 (×2): qty 1

## 2021-04-10 MED ORDER — KETOROLAC TROMETHAMINE 30 MG/ML IJ SOLN
30.0000 mg | Freq: Once | INTRAMUSCULAR | Status: AC
  Administered 2021-04-10: 30 mg via INTRAVENOUS

## 2021-04-10 MED ORDER — APREPITANT 40 MG PO CAPS
ORAL_CAPSULE | ORAL | Status: AC
  Filled 2021-04-10: qty 1

## 2021-04-10 MED ORDER — PROPOFOL 10 MG/ML IV BOLUS
INTRAVENOUS | Status: DC | PRN
  Administered 2021-04-10: 200 mg via INTRAVENOUS

## 2021-04-10 MED ORDER — FENTANYL CITRATE (PF) 250 MCG/5ML IJ SOLN
INTRAMUSCULAR | Status: AC
  Filled 2021-04-10: qty 5

## 2021-04-10 MED ORDER — FENTANYL CITRATE (PF) 100 MCG/2ML IJ SOLN
INTRAMUSCULAR | Status: AC
  Filled 2021-04-10: qty 2

## 2021-04-10 MED ORDER — FENTANYL CITRATE (PF) 250 MCG/5ML IJ SOLN
INTRAMUSCULAR | Status: DC | PRN
  Administered 2021-04-10: 50 ug via INTRAVENOUS
  Administered 2021-04-10: 150 ug via INTRAVENOUS
  Administered 2021-04-10: 100 ug via INTRAVENOUS
  Administered 2021-04-10: 50 ug via INTRAVENOUS

## 2021-04-10 MED ORDER — FENTANYL CITRATE PF 50 MCG/ML IJ SOSY: 25.0000 ug | PREFILLED_SYRINGE | INTRAMUSCULAR | Status: DC | PRN

## 2021-04-10 MED ORDER — SUGAMMADEX SODIUM 200 MG/2ML IV SOLN
INTRAVENOUS | Status: DC | PRN
  Administered 2021-04-10: 200 mg via INTRAVENOUS

## 2021-04-10 MED ORDER — ROCURONIUM BROMIDE 10 MG/ML (PF) SYRINGE
PREFILLED_SYRINGE | INTRAVENOUS | Status: AC
  Filled 2021-04-10: qty 10

## 2021-04-10 MED ORDER — DEXAMETHASONE SODIUM PHOSPHATE 10 MG/ML IJ SOLN
INTRAMUSCULAR | Status: DC | PRN
  Administered 2021-04-10: 10 mg via INTRAVENOUS

## 2021-04-10 MED ORDER — LIDOCAINE HCL (PF) 2 % IJ SOLN
INTRAMUSCULAR | Status: AC
  Filled 2021-04-10: qty 5

## 2021-04-10 MED ORDER — MIDAZOLAM HCL 2 MG/2ML IJ SOLN
INTRAMUSCULAR | Status: DC | PRN
Start: 2021-04-10 — End: 2021-04-10
  Administered 2021-04-10: 2 mg via INTRAVENOUS

## 2021-04-10 MED ORDER — LIDOCAINE HCL (CARDIAC) PF 100 MG/5ML IV SOSY
PREFILLED_SYRINGE | INTRAVENOUS | Status: DC | PRN
Start: 2021-04-10 — End: 2021-04-10
  Administered 2021-04-10: 60 mg via INTRAVENOUS

## 2021-04-10 MED ORDER — AMISULPRIDE (ANTIEMETIC) 5 MG/2ML IV SOLN: 10.0000 mg | Freq: Once | INTRAVENOUS | Status: DC | PRN

## 2021-04-10 MED ORDER — ROCURONIUM BROMIDE 10 MG/ML (PF) SYRINGE
PREFILLED_SYRINGE | INTRAVENOUS | Status: DC | PRN
  Administered 2021-04-10: 10 mg via INTRAVENOUS
  Administered 2021-04-10: 60 mg via INTRAVENOUS

## 2021-04-10 MED ORDER — MIDAZOLAM HCL 2 MG/2ML IJ SOLN
INTRAMUSCULAR | Status: AC
  Filled 2021-04-10: qty 2

## 2021-04-10 MED ORDER — ACETAMINOPHEN 10 MG/ML IV SOLN
INTRAVENOUS | Status: AC
  Filled 2021-04-10: qty 100

## 2021-04-10 SURGICAL SUPPLY — 36 items
ADH SKN CLS APL DERMABOND .7 (GAUZE/BANDAGES/DRESSINGS) ×1
APL PRP STRL LF DISP 70% ISPRP (MISCELLANEOUS) ×1
APPLIER CLIP 5 13 M/L LIGAMAX5 (MISCELLANEOUS) ×2
APR CLP MED LRG 5 ANG JAW (MISCELLANEOUS) ×1
BAG COUNTER SPONGE SURGICOUNT (BAG) IMPLANT
BAG SPEC RTRVL LRG 6X4 10 (ENDOMECHANICALS) ×1
BAG SPNG CNTER NS LX DISP (BAG)
CABLE HIGH FREQUENCY MONO STRZ (ELECTRODE) ×3 IMPLANT
CHLORAPREP W/TINT 26 (MISCELLANEOUS) ×3 IMPLANT
CLIP APPLIE 5 13 M/L LIGAMAX5 (MISCELLANEOUS) ×2 IMPLANT
COVER MAYO STAND XLG (MISCELLANEOUS) ×3 IMPLANT
DERMABOND ADVANCED (GAUZE/BANDAGES/DRESSINGS) ×1
DERMABOND ADVANCED .7 DNX12 (GAUZE/BANDAGES/DRESSINGS) ×2 IMPLANT
DRAPE C-ARM 42X120 X-RAY (DRAPES) IMPLANT
ELECT REM PT RETURN 15FT ADLT (MISCELLANEOUS) ×3 IMPLANT
GLOVE SURG ENC MOIS LTX SZ7.5 (GLOVE) ×3 IMPLANT
GOWN STRL REUS W/ TWL XL LVL3 (GOWN DISPOSABLE) ×4 IMPLANT
GOWN STRL REUS W/TWL XL LVL3 (GOWN DISPOSABLE) ×4
HEMOSTAT SURGICEL 4X8 (HEMOSTASIS) IMPLANT
IRRIG SUCT STRYKERFLOW 2 WTIP (MISCELLANEOUS) ×2
IRRIGATION SUCT STRKRFLW 2 WTP (MISCELLANEOUS) ×2 IMPLANT
KIT BASIN OR (CUSTOM PROCEDURE TRAY) ×3 IMPLANT
KIT TURNOVER KIT A (KITS) IMPLANT
PENCIL SMOKE EVACUATOR (MISCELLANEOUS) IMPLANT
POUCH SPECIMEN RETRIEVAL 10MM (ENDOMECHANICALS) ×3 IMPLANT
SCISSORS LAP 5X35 DISP (ENDOMECHANICALS) ×3 IMPLANT
SET CHOLANGIOGRAPH MIX (MISCELLANEOUS) IMPLANT
SET TUBE SMOKE EVAC HIGH FLOW (TUBING) ×3 IMPLANT
SLEEVE XCEL OPT CAN 5 100 (ENDOMECHANICALS) ×6 IMPLANT
SPIKE FLUID TRANSFER (MISCELLANEOUS) ×3 IMPLANT
SUT MNCRL AB 4-0 PS2 18 (SUTURE) ×3 IMPLANT
TOWEL OR 17X26 10 PK STRL BLUE (TOWEL DISPOSABLE) ×3 IMPLANT
TOWEL OR NON WOVEN STRL DISP B (DISPOSABLE) ×3 IMPLANT
TRAY LAPAROSCOPIC (CUSTOM PROCEDURE TRAY) ×3 IMPLANT
TROCAR BLADELESS OPT 5 100 (ENDOMECHANICALS) ×3 IMPLANT
TROCAR XCEL BLUNT TIP 100MML (ENDOMECHANICALS) ×3 IMPLANT

## 2021-04-10 NOTE — Anesthesia Postprocedure Evaluation (Signed)
Anesthesia Post Note ? ?Patient: HUTCHISON REDDISH ? ?Procedure(s) Performed: LAPAROSCOPIC CHOLECYSTECTOMY WITH INTRAOPERATIVE CHOLANGIOGRAM (Abdomen) ? ?  ? ?Patient location during evaluation: PACU ?Anesthesia Type: General ?Level of consciousness: awake ?Pain management: pain level controlled ?Vital Signs Assessment: post-procedure vital signs reviewed and stable ?Respiratory status: spontaneous breathing, nonlabored ventilation, respiratory function stable and patient connected to nasal cannula oxygen ?Cardiovascular status: blood pressure returned to baseline and stable ?Postop Assessment: no apparent nausea or vomiting ?Anesthetic complications: no ? ? ?No notable events documented. ? ?Last Vitals:  ?Vitals:  ? 04/10/21 1044 04/10/21 1133  ?BP: 116/70 105/64  ?Pulse: 98 99  ?Resp: 16 16  ?Temp: 36.7 ?C 36.8 ?C  ?SpO2: 95% 98%  ?  ?Last Pain:  ?Vitals:  ? 04/10/21 1005  ?TempSrc:   ?PainSc: 7   ? ? ?  ?  ?  ?  ?  ?  ? ?Kemora Pinard P Erlinda Solinger ? ? ? ? ?

## 2021-04-10 NOTE — Anesthesia Procedure Notes (Signed)
Procedure Name: Intubation ?Date/Time: 04/10/2021 7:39 AM ?Performed by: Raenette Rover, CRNA ?Pre-anesthesia Checklist: Patient identified, Emergency Drugs available, Suction available and Patient being monitored ?Patient Re-evaluated:Patient Re-evaluated prior to induction ?Oxygen Delivery Method: Circle system utilized ?Preoxygenation: Pre-oxygenation with 100% oxygen ?Induction Type: IV induction ?Ventilation: Mask ventilation without difficulty ?Laryngoscope Size: Mac and 4 ?Grade View: Grade III ?Tube type: Oral ?Tube size: 7.5 mm ?Number of attempts: 1 ?Airway Equipment and Method: Stylet ?Placement Confirmation: ETT inserted through vocal cords under direct vision, positive ETCO2 and breath sounds checked- equal and bilateral ?Secured at: 22 cm ?Tube secured with: Tape ?Dental Injury: Teeth and Oropharynx as per pre-operative assessment  ?Comments: Small mouth opening, tight despite paralytic; would recommend glidescope, but not difficult with MAC 4. Just not a good view.  ? ? ? ? ?

## 2021-04-10 NOTE — Progress Notes (Signed)
?PROGRESS NOTE ? ? ? ?Brandon Vaughn  JSH:[id number] DOB: 07-16-1985 DOA: 04/08/2021 ?PCP: Eartha Inch, MD  ? ? ? ?Brief Narrative:  ? ?Brandon Vaughn is a 36 y.o. male with history of bipolar disorder presented to the ER at med center with complaints of abdominal pain.   ?Found to have symptomatic cholelithiasis ?General surgery consulted, OR tomorrow on 3/19 ? ?Subjective: ? ?Seen after returned from OR, had some "soreness" related to surgery, otherwise , he is feeling much better ?He is hesitating to get the foley out, he wants to keep it in tonight and try to remove tomorrow ?Family at bedside  ? ?Assessment & Plan: ? Principal Problem: ?  Abdominal pain ?Active Problems: ?  MDD (major depressive disorder), severe (HCC) ?  Symptomatic cholelithiasis ?  LFT elevation ? ? ?Assessment and Plan: ? ?Acute Cholecystitis with gangrenous changes and with Cholelithiasis ?-presents with Abdominal pain with elevation of LFT, negative hepatitis panel, negative lipase ?-s/p lap chole on 3/19,  gen surg to decide on antibiotics use/duration ?-diet advancement per gen surg ?-appreciate gen surg input ? ?Acute urinary retention ?>600cc drained ,  Foley placed on 3/18 ?Need voiding trial prior to discharge ? ? ?Mild hypercalcemia, on presentation ?Calcium normalized after hydration ? ?Hyponatremia ?Likely due to dehydration ?He received hydration,  encourage oral intake ?Repeat bmp in am ? ?History of bipolar ?Continue home medication except Depakote due to LFT elevation ?Resume after LFT improvement  ?Check level ? ? Body mass index is 30 kg/m?Marland Kitchen. ? ? ?I have Reviewed nursing notes, Vitals, pain scores, I/o's, Lab results and  imaging results since pt's last encounter, details please see discussion above  ?I ordered the following labs:  ?Unresulted Labs (From admission, onward)  ? ?  Start     Ordered  ? 04/11/21 0500  CBC  Tomorrow morning,   R       ? 04/10/21 0959  ? 04/11/21 0500  Comprehensive metabolic panel  Tomorrow  morning,   R       ? 04/10/21 0959  ? 04/11/21 0500  Valproic acid level  Tomorrow morning,   R       ? 04/10/21 1837  ? 04/09/21 1714  Surgical pcr screen  Once,   R       ? 04/09/21 1713  ? ?  ?  ? ?  ? ? ? ?DVT prophylaxis: SCDs Start: 04/08/21 2224 ? ? ?Code Status:   Code Status: Full Code ? ?Family Communication: Wife at bedside ?Disposition:  ? ? ?Dispo: The patient is from: Home ?             Anticipated d/c is to: Home ?             Anticipated d/c date is: Likely tomorrow, need voiding trial, need general surgery clearance ? ?Antimicrobials:   ? ?Anti-infectives (From admission, onward)  ? ? Start     Dose/Rate Route Frequency Ordered Stop  ? 04/09/21 0400  cefTRIAXone (ROCEPHIN) 2 g in sodium chloride 0.9 % 100 mL IVPB       ? 2 g ?200 mL/hr over 30 Minutes Intravenous Every 24 hours 04/09/21 0331    ? 04/09/21 0400  metroNIDAZOLE (FLAGYL) IVPB 500 mg       ? 500 mg ?100 mL/hr over 60 Minutes Intravenous Every 12 hours 04/09/21 0331    ? ?  ? ? ? ? ? ?Objective: ?Vitals:  ? 04/10/21 1313 04/10/21 1439 04/10/21 1712  04/10/21 1804  ?BP: 113/62 (!) 110/99 108/68 (!) 105/58  ?Pulse: 100 95 (!) 116 (!) 111  ?Resp: 16 18  18   ?Temp: 98.4 ?F (36.9 ?C) 98.3 ?F (36.8 ?C) 98 ?F (36.7 ?C) 98.8 ?F (37.1 ?C)  ?TempSrc:  Oral Oral Oral  ?SpO2: 94% 100% 97% 93%  ?Weight:      ?Height:      ? ? ?Intake/Output Summary (Last 24 hours) at 04/10/2021 1847 ?Last data filed at 04/10/2021 1746 ?Gross per 24 hour  ?Intake 4133.54 ml  ?Output 3610 ml  ?Net 523.54 ml  ? ?Filed Weights  ? 04/08/21 1405  ?Weight: 108.9 kg  ? ? ?Examination: ? ?General exam: Looks much better, NAD ?Respiratory system: Clear to auscultation. Respiratory effort normal. ?Cardiovascular system:  RRR.  ?Gastrointestinal system: Postop changes , hypoactive bowel sounds . ?Central nervous system: Alert and oriented. No focal neurological deficits. ?Extremities:  no edema ?Skin: No rashes, lesions or ulcers ?Psychiatry: Calm and pleasant ? ? ? ?Data Reviewed:  I have personally reviewed  labs and visualized  imaging studies since the last encounter and formulate the plan  ? ? ? ? ? ? ?Scheduled Meds: ? amphetamine-dextroamphetamine  20 mg Oral Daily  ? amphetamine-dextroamphetamine  40 mg Oral Daily  ? aprepitant      ? vitamin C  500 mg Oral BID  ? ketorolac  30 mg Intravenous Q8H  ? ketorolac      ? lamoTRIgine  150 mg Oral q morning  ? multivitamin with minerals  1 tablet Oral Daily  ? venlafaxine  100 mg Oral Daily  ? venlafaxine  200 mg Oral QHS  ? vitamin B-12  1,000 mcg Oral Daily  ? ?Continuous Infusions: ? acetaminophen    ? cefTRIAXone (ROCEPHIN)  IV 2 g (04/10/21 0414)  ? lactated ringers 75 mL/hr at 04/10/21 0731  ? metronidazole 500 mg (04/10/21 1718)  ? ? ? LOS: 0 days  ? ? ? ?04/12/21, MD PhD FACP ?[facility] ? ?Available via Epic secure chat 7am-7pm for nonurgent issues ?Please page for urgent issues ?To page the attending provider between 7A-7P or the covering provider during after hours 7P-7A, please log into the web site [email or web address] and access using universal [place] password for that web site. If you do not have the password, please call the hospital operator. ? ? ? ?04/10/2021, 6:47 PM  ? ? ?

## 2021-04-10 NOTE — Op Note (Signed)
Laparoscopic Cholecystectomy with IOC Procedure Note ? ?Indications: This patient presents with symptomatic gallbladder disease and will undergo laparoscopic cholecystectomy. ? ?Pre-operative Diagnosis: symptomatic cholelithiasis ? ?Post-operative Diagnosis: acute cholecystitis with cholelithiasis ? ?Surgeon: Coralie Keens MD ? ?Assistants: Armandina Gemma, MD ? ?Anesthesia: General endotracheal anesthesia ? ?ASA Class: 2 ? ?Procedure Details  ?The patient was seen again in the Holding Room. The risks, benefits, complications, treatment options, and expected outcomes were discussed with the patient. The possibilities of reaction to medication, pulmonary aspiration, perforation of viscus, bleeding, recurrent infection, finding a normal gallbladder, the need for additional procedures, failure to diagnose a condition, the possible need to convert to an open procedure, and creating a complication requiring transfusion or operation were discussed with the patient. The likelihood of improving the patient's symptoms with return to their baseline status is good.  The patient and/or family concurred with the proposed plan, giving informed consent. The site of surgery properly noted. The patient was taken to Operating Room, identified as Al Corpus and the procedure verified as Laparoscopic Cholecystectomy with Intraoperative Cholangiogram. A Time Out was held and the above information confirmed. ? ?Prior to the induction of general anesthesia, antibiotic prophylaxis was administered. General endotracheal anesthesia was then administered and tolerated well. After the induction, the abdomen was prepped with Chloraprep and draped in the sterile fashion. The patient was positioned in the supine position. ? ?Local anesthetic agent was injected into the skin near the umbilicus and an incision made. We dissected down to the abdominal fascia with blunt dissection.  The fascia was incised vertically and we entered the peritoneal  cavity bluntly.  A pursestring suture of 0-Vicryl was placed around the fascial opening.  The Hasson cannula was inserted and secured with the stay suture.  Pneumoperitoneum was then created with CO2 and tolerated well without any adverse changes in the patient's vital signs. A 5-mm port was placed in the subxiphoid position.  Two 5-mm ports were placed in the right upper quadrant. All skin incisions were infiltrated with a local anesthetic agent before making the incision and placing the trocars.  ? ?We positioned the patient in reverse Trendelenburg, tilted slightly to the patient's left.  Omentum and fluid were identified in the right upper quadrant over the top of the gallbladder.  This was peeled down and the gallbladder was identified.  It was found to be acutely inflamed and distended with 1 small patch of gangrenous changes.  The decision was made to aspirate bile from the gallbladder in order to facilitate grasping the gallbladder.  This was done under direct vision.  The fundus was then grasped and retracted cephalad. Adhesions were lysed bluntly and with the electrocautery where indicated, taking care not to injure any adjacent organs or viscus. The infundibulum was grasped and retracted laterally, exposing the peritoneum overlying the triangle of Calot. This was then divided and exposed in a blunt fashion. A critical view of the cystic duct and cystic artery was obtained.  The cystic duct was clearly identified and bluntly dissected circumferentially. The cystic duct was ligated with a clip distally.   An incision was made in the cystic duct and the [facility] cholangiogram catheter introduced. The catheter was secured using a clip. A cholangiogram was then obtained which showed good visualization of the distal and proximal biliary tree with no sign of filling defects or obstruction.  Contrast flowed easily into the duodenum. The catheter was then removed.  ? ?The cystic duct was then ligated with clips and  divided. The cystic artery was identified, dissected free, ligated with clips and divided as well.  ? ?The gallbladder was dissected from the liver bed in retrograde fashion with the electrocautery. The gallbladder was removed and placed in an Endocatch sac. The liver bed was irrigated and inspected. Hemostasis was achieved with the electrocautery. Copious irrigation was utilized and was repeatedly aspirated until clear.  The gallbladder and Endocatch sac were then removed through the umbilical port site.  The pursestring suture was used to close the umbilical fascia.  I placed a separate figure-of-eight 0 Vicryl suture at the umbilicus again. ? ?We again inspected the right upper quadrant for hemostasis.  Pneumoperitoneum was released as we removed the trocars.  4-0 Monocryl was used to close the skin.  Skin glue was then applied. The patient was then extubated and brought to the recovery room in stable condition. Instrument, sponge, and needle counts were correct at closure and at the conclusion of the case.  ? ?Findings: ?Acute Cholecystitis with gangrenous changes and with Cholelithiasis ? ?Estimated Blood Loss: Minimal ?        ?Drains: 0 ?        ?Specimens: Gallbladder     ?      ?Complications: None; patient tolerated the procedure well. ?        ?Disposition: PACU - hemodynamically stable. ?        ?Condition: stable ?  ?

## 2021-04-10 NOTE — Progress Notes (Signed)
Patient's vital signs [place] turned to yellow. Discussed patient's conditions with CN Henna RN. Patient is alert and oriented. Given medicine for pain relief. Will continue monitor. ?

## 2021-04-10 NOTE — Anesthesia Preprocedure Evaluation (Addendum)
Anesthesia Evaluation  ?Patient identified by MRN, date of birth, ID band ?Patient awake ? ? ? ?Reviewed: ?Allergy & Precautions, NPO status , Patient's Chart, lab work & pertinent test results ? ?Airway ?Mallampati: III ? ?TM Distance: >3 FB ?Neck ROM: Full ? ? ? Dental ?no notable dental hx. ? ?  ?Pulmonary ?Current Smoker and Patient abstained from smoking.,  ?  ?Pulmonary exam normal ? ? ? ? ? ? ? Cardiovascular ?negative cardio ROS ?Normal cardiovascular exam ? ? ?  ?Neuro/Psych ?PSYCHIATRIC DISORDERS Depression Bipolar Disorder negative neurological ROS ?   ? GI/Hepatic ?negative GI ROS, Neg liver ROS,   ?Endo/Other  ?negative endocrine ROS ? Renal/GU ?negative Renal ROS  ? ?  ?Musculoskeletal ?negative musculoskeletal ROS ?(+)  ? Abdominal ?  ?Peds ? Hematology ?negative hematology ROS ?(+)   ?Anesthesia Other Findings ?SYMPTOMATIC GALLSTONES ? Reproductive/Obstetrics ? ?  ? ? ? ? ? ? ? ? ? ? ? ? ? ?  ?  ? ? ? ? ? ? ? ?Anesthesia Physical ?Anesthesia Plan ? ?ASA: 2 ? ?Anesthesia Plan: General  ? ?Post-op Pain Management:   ? ?Induction: Intravenous ? ?PONV Risk Score and Plan: 1 and Ondansetron, Aprepitant, Midazolam and Treatment may vary due to age or medical condition ? ?Airway Management Planned: Oral ETT ? ?Additional Equipment:  ? ?Intra-op Plan:  ? ?Post-operative Plan: Extubation in OR ? ?Informed Consent: I have reviewed the patients History and Physical, chart, labs and discussed the procedure including the risks, benefits and alternatives for the proposed anesthesia with the patient or authorized representative who has indicated his/her understanding and acceptance.  ? ? ? ?Dental advisory given ? ?Plan Discussed with: CRNA ? ?Anesthesia Plan Comments:   ? ? ? ? ? ?Anesthesia Quick Evaluation ? ?

## 2021-04-10 NOTE — Progress Notes (Signed)
Patient ID: Brandon Vaughn, male   DOB: May 28, 1985, 36 y.o.   MRN: [id number] ? ? ?Pre Procedure note for inpatients: ?  ?Brandon Vaughn has been scheduled for Procedure(s): ?LAPAROSCOPIC CHOLECYSTECTOMY WITH INTRAOPERATIVE CHOLANGIOGRAM (N/A) today. The various methods of treatment have been discussed with the patient. After consideration of the risks, benefits and treatment options the patient has consented to the planned procedure.  ? ?The patient has been seen and labs reviewed. There are no changes in the patient?s condition to prevent proceeding with the planned procedure today. ? ?Recent labs: ? ?Lab Results  ?Component Value Date  ? WBC 10.3 04/09/2021  ? HGB 14.9 04/09/2021  ? HCT 44.4 04/09/2021  ? PLT 250 04/09/2021  ? GLUCOSE 108 (H) 04/09/2021  ? CHOL 97 02/22/2011  ? TRIG 73 02/22/2011  ? HDL 29 (L) 02/22/2011  ? LDLCALC 53 02/22/2011  ? ALT 266 (H) 04/09/2021  ? AST 177 (H) 04/09/2021  ? NA 134 (L) 04/09/2021  ? K 4.0 04/09/2021  ? CL 96 (L) 04/09/2021  ? CREATININE 0.85 04/09/2021  ? BUN 12 04/09/2021  ? CO2 29 04/09/2021  ? TSH 1.273 04/18/2013  ? ? ?Abigail Miyamoto, MD ?04/10/2021 6:52 AM ? ? ?   ?

## 2021-04-10 NOTE — Transfer of Care (Signed)
Immediate Anesthesia Transfer of Care Note ? ?Patient: Brandon Vaughn ? ?Procedure(s) Performed: LAPAROSCOPIC CHOLECYSTECTOMY WITH INTRAOPERATIVE CHOLANGIOGRAM (Abdomen) ? ?Patient Location: PACU ? ?Anesthesia Type:General ? ?Level of Consciousness: drowsy and patient cooperative ? ?Airway & Oxygen Therapy: Patient Spontanous Breathing and Patient connected to face mask oxygen ? ?Post-op Assessment: Report given to RN and Post -op Vital signs reviewed and stable ? ?Post vital signs: Reviewed and stable ? ?Last Vitals:  ?Vitals Value Taken Time  ?BP 149/69 04/10/21 0835  ?Temp    ?Pulse 107 04/10/21 0838  ?Resp 21 04/10/21 0838  ?SpO2 96 % 04/10/21 0838  ?Vitals shown include unvalidated device data. ? ?Last Pain:  ?Vitals:  ? 04/10/21 0449  ?TempSrc: Oral  ?PainSc:   ?   ? ?Patients Stated Pain Goal: 3 (04/09/21 2026) ? ?Complications: No notable events documented. ?

## 2021-04-10 NOTE — Progress Notes (Signed)
Patient's vital signs mews turned to yellow. Discussed patient' conditions with CN. Given PRN meds as per ordered. Will continue monitor. ?

## 2021-04-10 NOTE — Anesthesia Postprocedure Evaluation (Signed)
Anesthesia Post Note ? ?Patient: Brandon Vaughn ? ?Procedure(s) Performed: LAPAROSCOPIC CHOLECYSTECTOMY WITH INTRAOPERATIVE CHOLANGIOGRAM (Abdomen) ? ?  ? ?Patient location during evaluation: PACU ?Anesthesia Type: General ?Level of consciousness: awake ?Pain management: pain level controlled ?Vital Signs Assessment: post-procedure vital signs reviewed and stable ?Respiratory status: spontaneous breathing, nonlabored ventilation, respiratory function stable and patient connected to nasal cannula oxygen ?Cardiovascular status: blood pressure returned to baseline and stable ?Postop Assessment: no apparent nausea or vomiting ?Anesthetic complications: no ? ? ?No notable events documented. ? ?Last Vitals:  ?Vitals:  ? 04/10/21 1044 04/10/21 1133  ?BP: 116/70 105/64  ?Pulse: 98 99  ?Resp: 16 16  ?Temp: 36.7 ?C 36.8 ?C  ?SpO2: 95% 98%  ?  ?Last Pain:  ?Vitals:  ? 04/10/21 1005  ?TempSrc:   ?PainSc: 7   ? ? ?  ?  ?  ?  ?  ?  ? ?Brandon Vaughn ? ? ? ? ?

## 2021-04-11 ENCOUNTER — Encounter (HOSPITAL_COMMUNITY): Payer: Self-pay | Admitting: Surgery

## 2021-04-11 DIAGNOSIS — R338 Other retention of urine: Secondary | ICD-10-CM

## 2021-04-11 DIAGNOSIS — E871 Hypo-osmolality and hyponatremia: Secondary | ICD-10-CM

## 2021-04-11 DIAGNOSIS — K8000 Calculus of gallbladder with acute cholecystitis without obstruction: Secondary | ICD-10-CM

## 2021-04-11 LAB — COMPREHENSIVE METABOLIC PANEL
ALT: 208 U/L — ABNORMAL HIGH (ref 0–44)
AST: 68 U/L — ABNORMAL HIGH (ref 15–41)
Albumin: 3.3 g/dL — ABNORMAL LOW (ref 3.5–5.0)
Alkaline Phosphatase: 100 U/L (ref 38–126)
Anion gap: 5 (ref 5–15)
BUN: 10 mg/dL (ref 6–20)
CO2: 26 mmol/L (ref 22–32)
Calcium: 8.8 mg/dL — ABNORMAL LOW (ref 8.9–10.3)
Chloride: 105 mmol/L (ref 98–111)
Creatinine, Ser: 0.82 mg/dL (ref 0.61–1.24)
GFR, Estimated: >60 mL/min (ref >60)
Glucose, Bld: 137 mg/dL — ABNORMAL HIGH (ref 70–99)
Potassium: 3.9 mmol/L (ref 3.5–5.1)
Sodium: 136 mmol/L (ref 135–145)
Total Bilirubin: 0.3 mg/dL (ref 0.3–1.2)
Total Protein: 6.2 g/dL — ABNORMAL LOW (ref 6.5–8.1)

## 2021-04-11 LAB — CBC
HCT: 36.3 % — ABNORMAL LOW (ref 39.0–52.0)
Hemoglobin: 12 g/dL — ABNORMAL LOW (ref 13.0–17.0)
MCH: 28.8 pg (ref 26.0–34.0)
MCHC: 33.1 g/dL (ref 30.0–36.0)
MCV: 87.3 fL (ref 80.0–100.0)
Platelets: 203 10*3/uL (ref 150–400)
RBC: 4.16 MIL/uL — ABNORMAL LOW (ref 4.22–5.81)
RDW: 13.2 % (ref 11.5–15.5)
WBC: 12.7 10*3/uL — ABNORMAL HIGH (ref 4.0–10.5)
nRBC: 0 % (ref 0.0–0.2)

## 2021-04-11 LAB — VALPROIC ACID LEVEL: Valproic Acid Lvl: <10 ug/mL — ABNORMAL LOW (ref 50.0–100.0)

## 2021-04-11 LAB — GLUCOSE, CAPILLARY
Glucose-Capillary: 177 mg/dL — ABNORMAL HIGH (ref 70–99)
Glucose-Capillary: 138 mg/dL — ABNORMAL HIGH (ref 70–99)

## 2021-04-11 LAB — CK: Total CK: 103 U/L (ref 49–397)

## 2021-04-11 MED ORDER — OXYCODONE HCL 5 MG PO TABS: 5.0000 mg | ORAL_TABLET | ORAL | 0 refills | Status: AC | PRN

## 2021-04-11 NOTE — Progress Notes (Signed)
Reviewed written d/c instructions w pt/wife and all questions answered. They both verbalized understanding. D/C ambulatory w all belongings in stable condition. ?

## 2021-04-11 NOTE — Assessment & Plan Note (Signed)
Mild and resolved after IV fluids. ?

## 2021-04-11 NOTE — Assessment & Plan Note (Signed)
Suspected due to acute cholecystitis and cholelithiasis. ?LFTs continue to improve. ?Follow-up LFTs closely as outpatient. ?

## 2021-04-11 NOTE — Assessment & Plan Note (Signed)
   Management as noted above. 

## 2021-04-11 NOTE — Discharge Summary (Signed)
Physician Discharge Summary  ?THEODORO BRECKER [id number] DOB: 18-Dec-1985 ? ?PCP: Chesley Noon, MD ? ?Admitted from: Home ?Discharged to: Home ? ?Admit date: 04/08/2021 ?Discharge date: 04/11/2021 ? ?Recommendations for Outpatient Follow-up:  ? ? Follow-up Information   ? ? [facility], [place]. Schedule an appointment as soon as possible for a visit in 2 week(s).   ?Specialty: General Surgery ?Contact information: ?[address] ?[address] ?[place] ?[phone] ? ?  ?  ? ? Chesley Noon, MD. Schedule an appointment as soon as possible for a visit in 1 week(s).   ?Specialty: Family Medicine ?Why: To be seen with repeat labs (CBC & CMP). ?Contact information: ?[address] ?[place] [address] ?[phone] ? ? ?  ?  ? ? Ricard Dillon, MD. Schedule an appointment as soon as possible for a visit.   ?Specialty: Psychiatry ?Contact information: ?[address] ?[address] ?[place] [address] ? ? ?  ?  ? ?  ?  ? ?  ? ? ?Home Health: None ?  ? ?Equipment/Devices: None ?  ? ?Discharge Condition: Improved and stable ?  Code Status: Full Code ?Diet recommendation:  ?Discharge Diet Orders (From admission, onward)  ? ?  Start     Ordered  ? 04/11/21 0000  Diet general       ? 04/11/21 0954  ? ?  ?  ? ?  ?  ? ?Discharge Diagnoses:  ?Principal Problem: ?  Cholecystitis, acute with cholelithiasis ?Active Problems: ?  Symptomatic cholelithiasis ?  LFT elevation ?  MDD (major depressive disorder), severe ([place]) ?  Acute urinary retention ?  Hypercalcemia ?  Hyponatremia ? ? ?Brief Hospital Course: ?36 year old married male, medical history significant for bipolar disorder presented to the ED at med center with complaints of abdominal pain.  Diagnosed with symptomatic cholelithiasis.  General surgery was consulted and underwent laparoscopic cholecystectomy on 3/19.  General surgery has followed up today, patient doing well and has been cleared for discharge  home. ? ?Assessment and Plan: ?* Cholecystitis, acute with cholelithiasis ?Presented with abdominal pain, LFT elevation. ?Negative acute hepatitis panel and lipase. ?Underwent extensive imaging including RUQ ultrasound, CT abdomen, MRCP with results as noted below. ?General surgery was consulted and patient underwent laparoscopic cholecystectomy on 3/19.  Intraoperative cholangiogram negative. ?Postop day 1, 3/20, day of discharge, general surgery has followed up, patient doing well, has been cleared for discharge home and have arranged outpatient follow-up.  No antibiotics recommended at discharge. ? ?Symptomatic cholelithiasis ?Management as noted above. ? ?LFT elevation ?Suspected due to acute cholecystitis and cholelithiasis. ?LFTs continue to improve. ?Follow-up LFTs closely as outpatient. ? ?Acute urinary retention ?Suspected due to pain and pain medications. ?As per spouse, has prior history of same due to above. ?Foley catheter was discontinued on day of discharge and patient has voided spontaneously. ?Resolved. ? ?MDD (major depressive disorder), severe ([place]) ?Follows with Dr. Reece Levy med tried psychiatry ?Depakote was temporarily held due to LFT elevation.  However LFT elevation felt due to biliary disease rather than Depakote. ?Depakote resumed at time of discharge. ?Follow LFTs closely as outpatient. ?Valproic acid level subtherapeutic at <10. ? ?Hypercalcemia ?Mild and resolved after IV fluids. ? ?Hyponatremia ?Suspected due to dehydration. ?Resolved. ? ? ?Body mass index is 30 kg/m?. ? ? ?Consultations: ?General ? ?Procedures: ?S/p laparoscopic cholecystectomy 3/19. ?Foley catheter 3/18 - 3/20. ? ?Discharge Instructions ?Discharge Instructions   ? ? Call MD for:  difficulty breathing, headache or  visual disturbances   Complete by: As directed ?  ? Call MD for:  extreme fatigue   Complete by: As directed ?  ? Call MD for:  persistant dizziness or light-headedness   Complete by: As directed ?  ? Call MD  for:  persistant nausea and vomiting   Complete by: As directed ?  ? Call MD for:  redness, tenderness, or signs of infection (pain, swelling, redness, odor or green/yellow discharge around incision site)   Complete by: As directed ?  ? Call MD for:  severe uncontrolled pain   Complete by: As directed ?  ? Call MD for:  temperature >100.4   Complete by: As directed ?  ? Diet general   Complete by: As directed ?  ? Increase activity slowly   Complete by: As directed ?  ? No wound care   Complete by: As directed ?  ? ?  ? ?  ?Medication List  ?  ? ?TAKE these medications   ? ?amphetamine-dextroamphetamine 20 MG tablet ?Commonly known as: ADDERALL ?Take 20-40 mg by mouth 2 (two) times daily. Take 2 tablets (40 mg) in the morning and Take 1 tablet (20 mg) in afternoon ?  ?divalproex 500 MG 24 hr tablet ?Commonly known as: DEPAKOTE ER ?Take 1,000 mg by mouth at bedtime. ?  ?GINKOGIN PO ?Take 1 capsule by mouth daily. ?  ?lamoTRIgine 150 MG tablet ?Commonly known as: LAMICTAL ?Take 150 mg by mouth every morning. ?  ?multivitamin Tabs tablet ?Take 1 tablet by mouth daily. ?  ?oxyCODONE 5 MG immediate release tablet ?Commonly known as: Oxy IR/ROXICODONE ?Take 1 tablet (5 mg total) by mouth every 4 (four) hours as needed for moderate pain or severe pain. ?  ?venlafaxine 100 MG tablet ?Commonly known as: EFFEXOR ?Take 100-200 mg by mouth 2 (two) times daily. Take 1 tablet (100 mg) in the morning & Take 2 tablets (200 mg) at bedtime ?  ?vitamin B-12 1000 MCG tablet ?Commonly known as: CYANOCOBALAMIN ?Take 1,000 mcg by mouth daily. ?  ?vitamin C 500 MG tablet ?Commonly known as: ASCORBIC ACID ?Take 500 mg by mouth 2 (two) times daily. ?  ? ?  ? ?Allergies  ?Allergen Reactions  ? Bee Venom Anaphylaxis  ? Codeine   ? Hydrocodone   ? ?Procedures/Studies: ?DG Cholangiogram Operative ? ?Result Date: 04/10/2021 ?CLINICAL DATA:  Fluoroscopy provided during cholangiogram. Fluoroscopy time: 17 seconds. EXAM: INTRAOPERATIVE CHOLANGIOGRAM  TECHNIQUE: Cholangiographic images from the C-arm fluoroscopic device were submitted for interpretation post-operatively. Please see the procedural report for the amount of contrast and the fluoroscopy time utilized. FLUOROSCOPY: Radiation Exposure Index (as provided by the fluoroscopic device): 12.54 mGy [place] COMPARISON:  None. FINDINGS: No filling defects are seen within the opacified bile ducts including the common bile duct. Contrast empties into the duodenum. IMPRESSION: No filling defects identified within the visualized biliary tree. Electronically Signed   By: Dorise Bullion III M.D.   On: 04/10/2021 08:47  ? ?CT ABDOMEN PELVIS W CONTRAST ? ?Result Date: 04/08/2021 ?CLINICAL DATA:  Biliary obstruction suspected (Ped 0-17y) mass in upper abdomen Patient reports epigastric pain for 1 day.  Nausea. EXAM: CT ABDOMEN AND PELVIS WITH CONTRAST TECHNIQUE: Multidetector CT imaging of the abdomen and pelvis was performed using the standard protocol following bolus administration of intravenous contrast. RADIATION DOSE REDUCTION: This exam was performed according to the departmental dose-optimization program which includes automated exposure control, adjustment of the mA and/or kV according to patient size and/or use of iterative  reconstruction technique. CONTRAST:  167mL OMNIPAQUE IOHEXOL 300 MG/ML  SOLN COMPARISON:  Right upper quadrant ultrasound earlier today. FINDINGS: Lower chest: Ventilatory changes in the dependent lungs. No pleural fluid. Hepatobiliary: Diffusely decreased hepatic density typical of steatosis. There is no discrete liver lesion. Slightly hyperdense gallbladder contents. No gallbladder calcifications. Questionable gallbladder wall thickening, although this was not seen on recent ultrasound. No definite pericholecystic inflammation. Normal common bile duct. Pancreas: No ductal dilatation or inflammation.  No pancreatic mass. Spleen: Normal in size without focal abnormality. Adrenals/Urinary  Tract: Normal adrenal glands. No hydronephrosis or perinephric edema. Homogeneous renal enhancement. There are tiny hypodense structures in the posterior mid right and lower left kidney that are likely a cyst

## 2021-04-11 NOTE — Assessment & Plan Note (Signed)
Suspected due to dehydration. ?Resolved. ?

## 2021-04-11 NOTE — Hospital Course (Signed)
36 year old married male, medical history significant for bipolar disorder presented to the ED at med center with complaints of abdominal pain.  Diagnosed with symptomatic cholelithiasis.  General surgery was consulted and underwent laparoscopic cholecystectomy on 3/19.  General surgery has followed up today, patient doing well and has been cleared for discharge home. ?

## 2021-04-11 NOTE — Discharge Instructions (Addendum)
LAPAROSCOPIC SURGERY: POST OP INSTRUCTIONS  DIET: Follow a light bland diet the first 24 hours after arrival home, such as soup, liquids, crackers, etc.  Be sure to include lots of fluids daily.  Avoid fast food or heavy meals as your are more likely to get nauseated.  Eat a low fat the next few days after surgery.   Take your usually prescribed home medications unless otherwise directed. PAIN CONTROL: Pain is best controlled by a usual combination of three different methods TOGETHER: Ice/Heat Over the counter pain medication Prescription pain medication Most patients will experience some swelling and bruising around the incisions.  Ice packs or heating pads (30-60 minutes up to 6 times a day) will help. Use ice for the first few days to help decrease swelling and bruising, then switch to heat to help relax tight/sore spots and speed recovery.  Some people prefer to use ice alone, heat alone, alternating between ice & heat.  Experiment to what works for you.  Swelling and bruising can take several weeks to resolve.   It is helpful to take an over-the-counter pain medication regularly for the first few weeks.  Choose one of the following that works best for you: Naproxen (Aleve, etc)  Two 220mg tabs twice a day Ibuprofen (Advil, etc) Three 200mg tabs four times a day (every meal & bedtime) A  prescription for pain medication (such as percocet, vicodin, oxycodone, hydrocodone, etc) should be given to you upon discharge.  Take your pain medication as prescribed.  If you are having problems/concerns with the prescription medicine (does not control pain, nausea, vomiting, rash, itching, etc), please call us [phone] to see if we need to switch you to a different pain medicine that will work better for you and/or control your side effect better. If you need a refill on your pain medication, please contact your pharmacy.  They will contact our office to request authorization. Prescriptions will not be  filled after 5 pm or on week-ends.   Avoid getting constipated.  Between the surgery and the pain medications, it is common to experience some constipation.  Increasing fluid intake and taking a fiber supplement (such as Metamucil, Citrucel, FiberCon, MiraLax, etc) 1-2 times a day regularly will usually help prevent this problem from occurring.  A mild laxative (prune juice, Milk of Magnesia, MiraLax, etc) should be taken according to package directions if there are no bowel movements after 48 hours.   Watch out for diarrhea.  If you have many loose bowel movements, simplify your diet to bland foods & liquids for a few days.  Stop any stool softeners and decrease your fiber supplement.  Switching to mild anti-diarrheal medications (Kayopectate, Pepto Bismol) can help.  If this worsens or does not improve, please call us. Wash / shower every day.  You may shower over the dressings as they are waterproof.  Continue to shower over incision(s) after the dressing is off. Remove your waterproof bandages 5 days after surgery.  You may leave the incision open to air.  You may replace a dressing/Band-Aid to cover the incision for comfort if you wish.  ACTIVITIES as tolerated:   You may resume regular (light) daily activities beginning the next day--such as daily self-care, walking, climbing stairs--gradually increasing activities as tolerated.  If you can walk 30 minutes without difficulty, it is safe to try more intense activity such as jogging, treadmill, bicycling, low-impact aerobics, swimming, etc. Save the most intensive and strenuous activity for last such as sit-ups, heavy   lifting, contact sports, etc  Refrain from any heavy lifting or straining until you are off narcotics for pain control.   ?DO NOT PUSH THROUGH PAIN.  Let pain be your guide: If it hurts to do something, don't do it.  Pain is your body warning you to avoid that activity for another week until the pain goes down. ?You may drive when you are  no longer taking prescription pain medication, you can comfortably wear a seatbelt, and you can safely maneuver your car and apply brakes. ?You may have sexual intercourse when it is comfortable.  ?FOLLOW UP in our office ?Please call [facility] at [phone] to set up an appointment to see your surgeon in the office for a follow-up appointment approximately 2-3 weeks after your surgery. ?Make sure that you call for this appointment the day you arrive home to insure a convenient appointment time. ?10. IF YOU HAVE DISABILITY OR FAMILY LEAVE FORMS, BRING THEM TO THE OFFICE FOR PROCESSING.  DO NOT GIVE THEM TO YOUR DOCTOR. ? ? ?WHEN TO CALL us [phone]: ?Poor pain control ?Reactions / problems with new medications (rash/itching, nausea, etc)  ?Fever over 101.5 F (38.5 C) ?Inability to urinate ?Nausea and/or vomiting ?Worsening swelling or bruising ?Continued bleeding from incision. ?Increased pain, redness, or drainage from the incision ? ? The clinic staff is available to answer your questions during regular business hours (8:30am-5pm).  Please don?t hesitate to call and ask to speak to one of our nurses for clinical concerns.  ? If you have a medical emergency, go to the nearest emergency room or call 911. ? A surgeon from [facility] is always on call at the hospitals ? ? ?[facility], [place] ?[address], [place], [place]  [address] ? ?MAIN: [phone] ? TOLL FREE: [phone] ?  ?FAX [phone] ?[email or web address] ? ? ?Additional discharge instructions ? ?Additional Discharge Instructions ? ? ?Please get your medications reviewed and adjusted by your Primary MD. ? ?Please request your Primary MD to go over all Hospital Tests and Procedure/Radiological results at the follow up, please get all Hospital records sent to your Prim MD by signing hospital release before you go home. ? ?If you had Pneumonia of Lung problems at the Hospital: ?Please get a 2  view Chest X ray done in approximately 4 weeks after hospital discharge or sooner if instructed by your Primary MD. ? ?If you have Congestive Heart Failure: ?Please call your Cardiologist or Primary MD anytime you have any of the following symptoms:  ?1) 3 pound weight gain in 24 hours or 5 pounds in 1 week  ?2) shortness of breath, with or without a dry hacking cough  ?3) swelling in the hands, feet or stomach  ?4) if you have to sleep on extra pillows at night in order to breathe ? ?Follow cardiac low salt diet and 1.5 lit/day fluid restriction. ? ?If you have diabetes ?Accuchecks 4 times/day, Once in AM empty stomach and then before each meal. ?Log in all results and show them to your primary doctor at your next visit. ?If any glucose reading is under 80 or above 300 call your primary MD immediately. ? ?If you have Seizure/Convulsions/Epilepsy: ?Please do not drive, operate heavy machinery, participate in activities at heights or participate in high speed sports until you have seen by Primary MD or a Neurologist and advised to do so again. ?Per [facility] statutes, patients with seizures are not allowed to drive  until they have been seizure-free for six months.  ?Use caution when using heavy equipment or power tools. Avoid working on ladders or at heights. Take showers instead of baths. Ensure the water temperature is not too high on the home water heater. Do not go swimming alone. Do not lock yourself in a room alone (i.e. bathroom). When caring for infants or small children, sit down when holding, feeding, or changing them to minimize risk of injury to the child in the event you have a seizure. Maintain good sleep hygiene. Avoid alcohol.  ? ?If you had Gastrointestinal Bleeding: ?Please ask your Primary MD to check a complete blood count within one week of discharge or at your next visit. Your endoscopic/colonoscopic biopsies that are pending at the time of discharge, will also need to followed by your  Primary MD. ? ?Get Medicines reviewed and adjusted. ?Please take all your medications with you for your next visit with your Primary MD ? ?Please request your Primary MD to go over all hospital tests

## 2021-04-11 NOTE — Assessment & Plan Note (Addendum)
Presented with abdominal pain, LFT elevation. ?Negative acute hepatitis panel and lipase. ?Underwent extensive imaging including RUQ ultrasound, CT abdomen, MRCP with results as noted below. ?General surgery was consulted and patient underwent laparoscopic cholecystectomy on 3/19.  Intraoperative cholangiogram negative. ?Postop day 1, 3/20, day of discharge, general surgery has followed up, patient doing well, has been cleared for discharge home and have arranged outpatient follow-up.  No antibiotics recommended at discharge. ?

## 2021-04-11 NOTE — Progress Notes (Addendum)
1 Day Post-Op lap chole with IOC ?Subjective: ?Doing well, tolerating a diet, ambulating in hall ? ?Objective: ?Vital signs in last 24 hours: ?Temp:  [97.5 ?F (36.4 ?C)-98.8 ?F (37.1 ?C)] 98.1 ?F (36.7 ?C) (03/20 0530) ?Pulse Rate:  [95-116] 100 (03/20 0530) ?Resp:  [15-21] 18 (03/20 0530) ?BP: (105-149)/(55-99) 123/65 (03/20 0530) ?SpO2:  [90 %-100 %] 96 % (03/20 0530) ? ? ?Intake/Output from previous day: ?03/19 0701 - 03/20 0700 ?In: 4830 [P.O.:1930; I.V.:2600; IV Piggyback:300] ?Out: 7210 [Urine:7200; Blood:10] ?Intake/Output this shift: ?No intake/output data recorded. ? ? ?General appearance: alert and cooperative ?GI: soft, appropriately tender ? ?Incision: no significant drainage ? ?Lab Results:  ?Recent Labs  ?  04/09/21 ?0410 04/11/21 ?0418  ?WBC 10.3 12.7*  ?HGB 14.9 12.0*  ?HCT 44.4 36.3*  ?PLT 250 203  ? ?BMET ?Recent Labs  ?  04/10/21 ?0428 04/11/21 ?0418  ?NA 131* 136  ?K 3.6 3.9  ?CL 94* 105  ?CO2 28 26  ?GLUCOSE 92 137*  ?BUN 12 10  ?CREATININE 0.88 0.82  ?CALCIUM 8.8* 8.8*  ? ?PT/INR ?No results for input(s): LABPROT, INR in the last 72 hours. ?ABG ?No results for input(s): PHART, HCO3 in the last 72 hours. ? ?Invalid input(s): PCO2, PO2 ? ?MEDS, Scheduled ? amphetamine-dextroamphetamine  20 mg Oral Daily  ? amphetamine-dextroamphetamine  40 mg Oral Daily  ? vitamin C  500 mg Oral BID  ? lamoTRIgine  150 mg Oral q morning  ? multivitamin with minerals  1 tablet Oral Daily  ? venlafaxine  100 mg Oral Daily  ? venlafaxine  200 mg Oral QHS  ? vitamin B-12  1,000 mcg Oral Daily  ? ? ?Studies/Results: ?DG Cholangiogram Operative ? ?Result Date: 04/10/2021 ?CLINICAL DATA:  Fluoroscopy provided during cholangiogram. Fluoroscopy time: 17 seconds. EXAM: INTRAOPERATIVE CHOLANGIOGRAM TECHNIQUE: Cholangiographic images from the C-arm fluoroscopic device were submitted for interpretation post-operatively. Please see the procedural report for the amount of contrast and the fluoroscopy time utilized. FLUOROSCOPY:  Radiation Exposure Index (as provided by the fluoroscopic device): 12.54 mGy Kerma COMPARISON:  None. FINDINGS: No filling defects are seen within the opacified bile ducts including the common bile duct. Contrast empties into the duodenum. IMPRESSION: No filling defects identified within the visualized biliary tree. Electronically Signed   By: Gerome Sam III M.D.   On: 04/10/2021 08:47   ? ?Assessment: ?s/p Procedure(s): ?LAPAROSCOPIC CHOLECYSTECTOMY WITH INTRAOPERATIVE CHOLANGIOGRAM ?Patient Active Problem List  ? Diagnosis Date Noted  ? Symptomatic cholelithiasis   ? LFT elevation   ? Abdominal pain 04/08/2021  ? MDD (major depressive disorder), severe (HCC) 04/18/2013  ? Chest pain, atypical 02/21/2011  ? ? ?Expected post op course ? ?Plan: ?Ok for d/c from surgical standpoint ?Urinary retention per medical team ?Pain Rx sent to pharmacy ?Discharge instructions and f/u info written ? ? LOS: 0 days  ? ? ? ?Marland KitchenVanita Panda, MD ?[facility], [place] ? ? ? ?04/11/2021 ?8:23 AM ? ? ? ?  ?

## 2021-04-11 NOTE — Progress Notes (Signed)
Transition of Care (TOC) Screening Note ? ?Patient Details  ?Name: Brandon Vaughn ?Date of Birth: 12-06-1985 ? ?Transition of Care (TOC) CM/SW Contact:    ?Ewing Schlein, LCSW ?Phone Number: ?04/11/2021, 10:06 AM ? ?Transition of Care Department [facility]) has reviewed patient and no TOC needs have been identified at this time. We will continue to monitor patient advancement through interdisciplinary progression rounds. If new patient transition needs arise, please place a TOC consult. ?

## 2021-04-11 NOTE — Assessment & Plan Note (Addendum)
Follows with Dr. Betti Cruz med tried psychiatry ?Depakote was temporarily held due to LFT elevation.  However LFT elevation felt due to biliary disease rather than Depakote. ?Depakote resumed at time of discharge. ?Follow LFTs closely as outpatient. ?Valproic acid level subtherapeutic at <10. ?

## 2021-04-11 NOTE — Assessment & Plan Note (Signed)
Suspected due to pain and pain medications. ?As per spouse, has prior history of same due to above. ?Foley catheter was discontinued on day of discharge and patient has voided spontaneously. ?Resolved. ?

## 2021-04-12 LAB — SURGICAL PATHOLOGY
